# Patient Record
Sex: Female | Born: 1945 | Race: Black or African American | Hispanic: No | Smoking: Never smoker
Health system: Southern US, Community
[De-identification: ages and names within clinical notes are randomized; demographics above are authoritative.]

## PROBLEM LIST (undated history)

## (undated) DIAGNOSIS — I10 Essential (primary) hypertension: Secondary | ICD-10-CM

## (undated) HISTORY — PX: TONSILLECTOMY: SUR1361

---

## 2007-09-12 ENCOUNTER — Emergency Department (HOSPITAL_COMMUNITY): Admission: EM | Admit: 2007-09-12 | Discharge: 2007-09-12 | Payer: Self-pay | Admitting: Family Medicine

## 2007-09-13 ENCOUNTER — Emergency Department (HOSPITAL_COMMUNITY): Admission: EM | Admit: 2007-09-13 | Discharge: 2007-09-14 | Payer: Self-pay | Admitting: Family Medicine

## 2007-10-04 ENCOUNTER — Ambulatory Visit: Payer: Self-pay | Admitting: Internal Medicine

## 2007-10-06 ENCOUNTER — Ambulatory Visit: Payer: Self-pay | Admitting: *Deleted

## 2007-10-20 ENCOUNTER — Ambulatory Visit: Payer: Self-pay | Admitting: Internal Medicine

## 2007-11-20 ENCOUNTER — Ambulatory Visit: Payer: Self-pay | Admitting: Internal Medicine

## 2007-12-04 ENCOUNTER — Ambulatory Visit: Payer: Self-pay | Admitting: Internal Medicine

## 2007-12-25 ENCOUNTER — Ambulatory Visit: Payer: Self-pay | Admitting: Internal Medicine

## 2008-01-15 ENCOUNTER — Ambulatory Visit: Payer: Self-pay | Admitting: Internal Medicine

## 2008-02-15 ENCOUNTER — Encounter (INDEPENDENT_AMBULATORY_CARE_PROVIDER_SITE_OTHER): Payer: Self-pay | Admitting: Family Medicine

## 2008-02-15 ENCOUNTER — Ambulatory Visit: Payer: Self-pay | Admitting: Internal Medicine

## 2008-02-15 LAB — CONVERTED CEMR LAB
AST: 78 units/L — ABNORMAL HIGH (ref 0–37)
Alkaline Phosphatase: 342 units/L — ABNORMAL HIGH (ref 39–117)
BUN: 12 mg/dL (ref 6–23)
Glucose, Bld: 132 mg/dL — ABNORMAL HIGH (ref 70–99)
HDL: 41 mg/dL (ref >39)
LDL Cholesterol: 153 mg/dL — ABNORMAL HIGH (ref 0–99)
Total Bilirubin: 0.8 mg/dL (ref 0.3–1.2)
Total CHOL/HDL Ratio: 5.4
Triglycerides: 128 mg/dL (ref <150)
VLDL: 26 mg/dL (ref 0–40)

## 2008-03-14 ENCOUNTER — Ambulatory Visit: Payer: Self-pay | Admitting: Internal Medicine

## 2008-03-15 ENCOUNTER — Encounter (INDEPENDENT_AMBULATORY_CARE_PROVIDER_SITE_OTHER): Payer: Self-pay | Admitting: Family Medicine

## 2008-03-15 LAB — CONVERTED CEMR LAB: Hepatitis B Surface Ag: NEGATIVE

## 2008-03-20 ENCOUNTER — Ambulatory Visit: Payer: Self-pay | Admitting: Internal Medicine

## 2008-05-14 ENCOUNTER — Ambulatory Visit: Payer: Self-pay | Admitting: Internal Medicine

## 2009-10-04 IMAGING — CR DG CHEST 2V
2 series · 2 of 2 positions shown · non-contrast
Comparison: None.

CLINICAL DATA: Chest pain and hypertension.  
 CHEST - 2 VIEW ? 09/13/07:

[w chest pa]
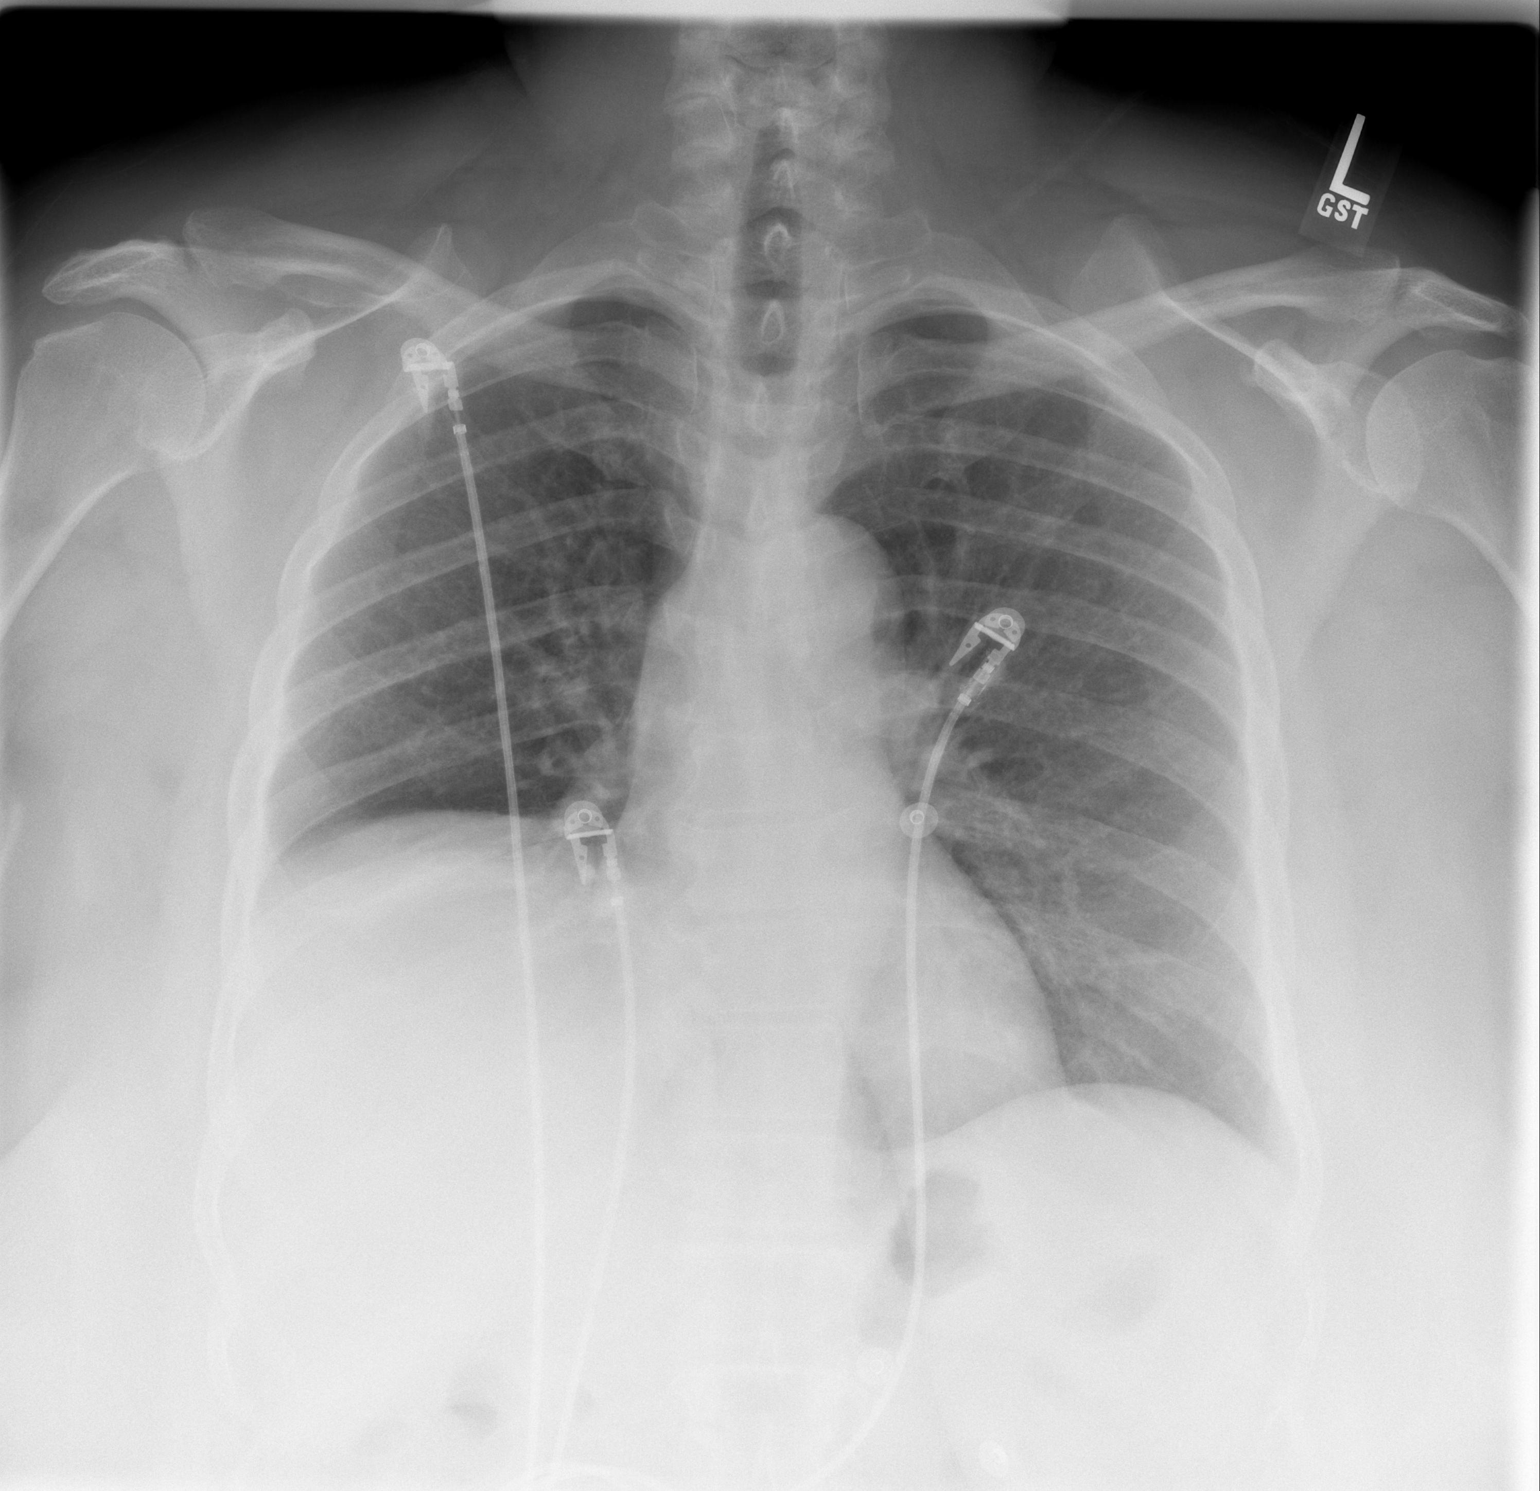

[w chest lat]
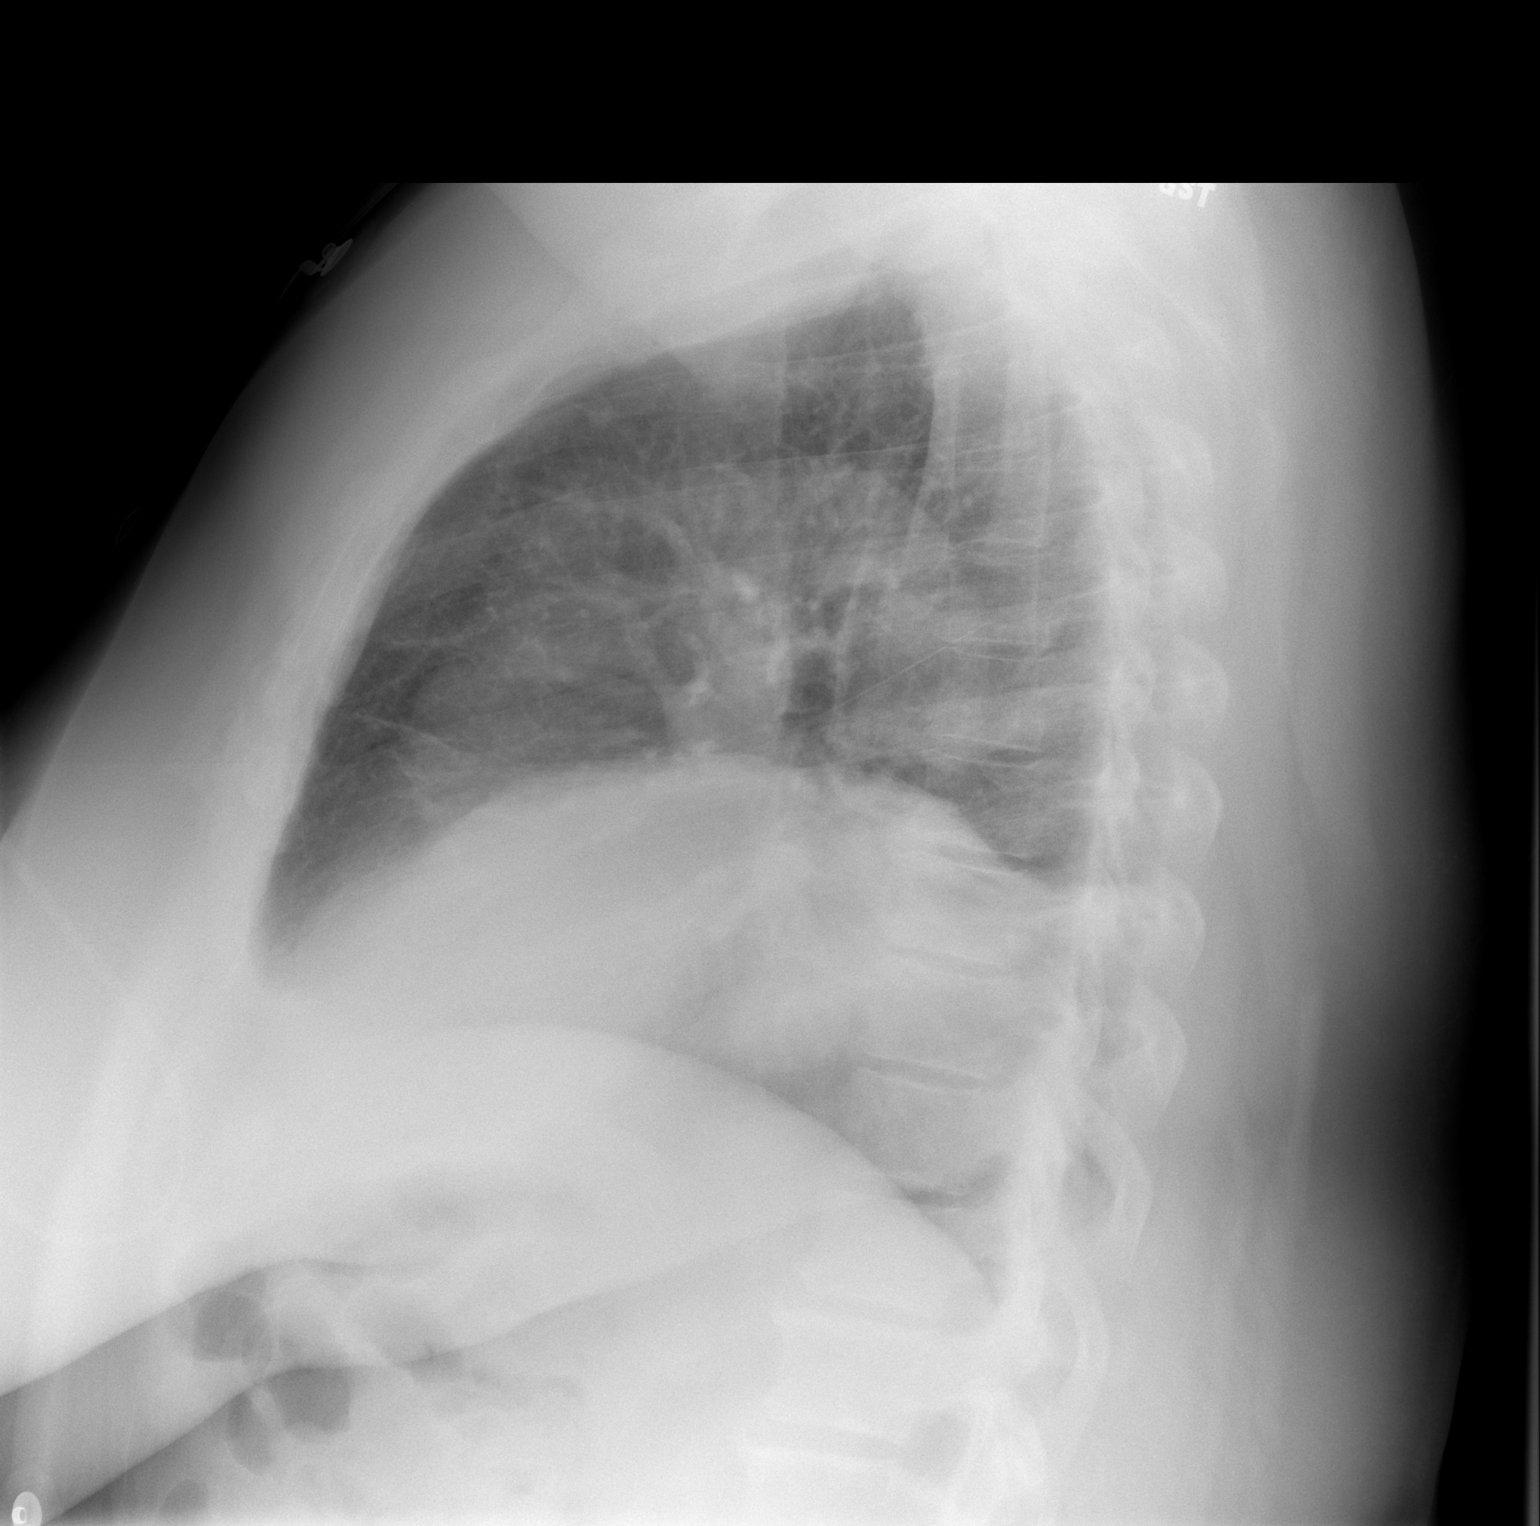

[2 of 2 positions shown; findings below may reference images not displayed]

FINDINGS: There is elevation of the right hemidiaphragm, up to 7 cm above the left hemidiaphragm.  There is some passive atelectasis at the right lung base.  No significant blunting of the right costophrenic angle is identified, although there may be slight blunting of the posterior costophrenic angle on the right such that subpulmonic effusion cannot be totally excluded. 
 The left lung appears clear.  Cardiac and mediastinal contours appear normal.
IMPRESSION: Elevation of the right hemidiaphragm with right basilar atelectasis.  I cannot totally exclude subpulmonic effusion on the right.

## 2009-10-05 IMAGING — CT CT CHEST W/ CM
2 of 3 series · 15 of 36 positions shown, 18 images · IV contrast (APPLIED)
Comparison: Chest radiograph dated 09/13/07.

CLINICAL DATA: 61 year-old-female question subpulmonic effusion.  Chest pain and shortness of breath. 
CHEST CT WITH CONTRAST:
TECHNIQUE: Multidetector CT imaging of the chest was performed following the standard protocol during bolus administration of intravenous contrast.
Contrast:    100 mL Omnipaque 300.

[Series 2: routine chest 5.0 st · axial · 0.71mm/px · z∈[-299,-84]mm · 12 of 51 slices shown, 15 images]
[im 4/51  mediastinal]
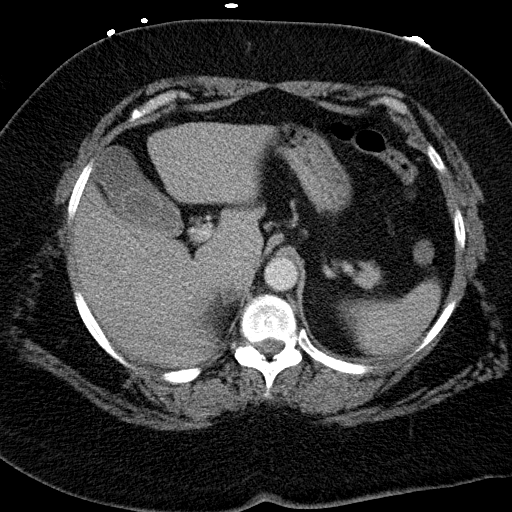
[im 4/51  lung]
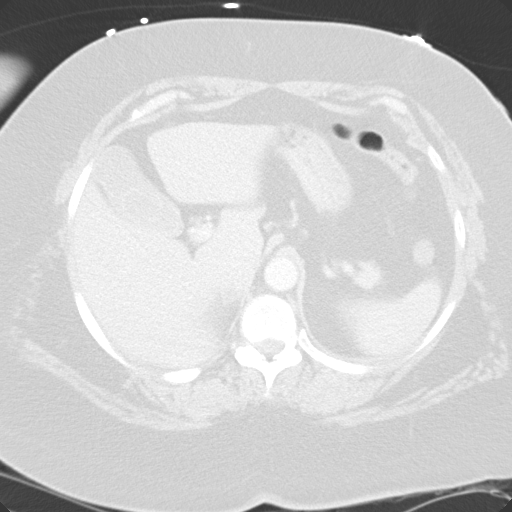
[im 8/51  lung]
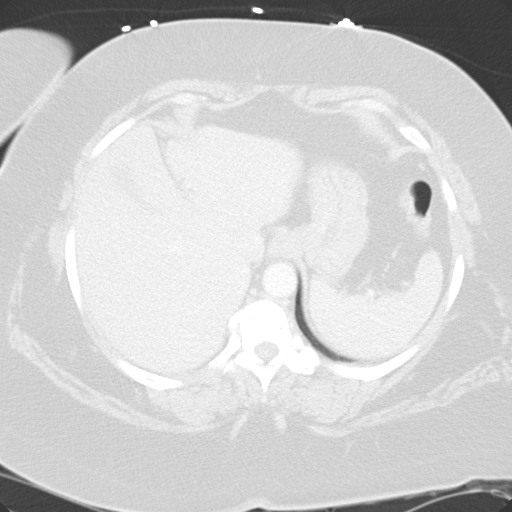
[im 12/51  lung]
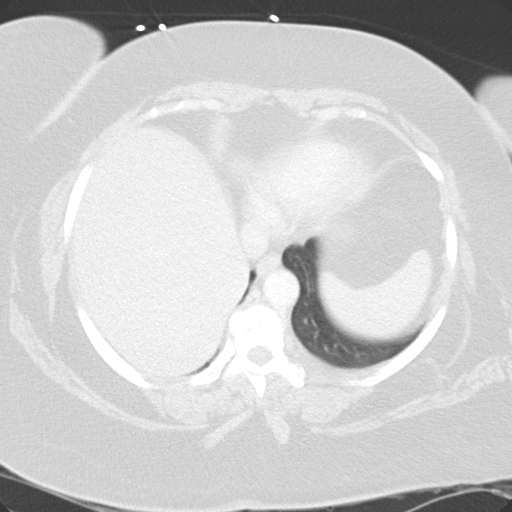
[im 15/51  lung]
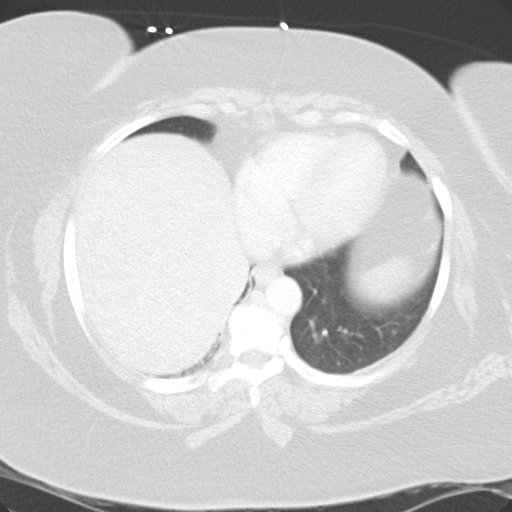
[im 19/51  mediastinal]
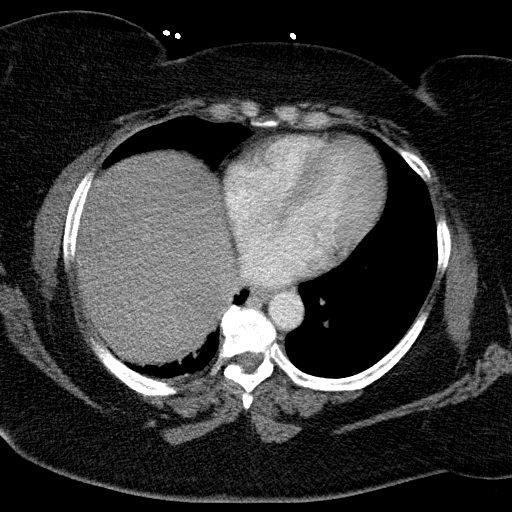
[im 19/51  lung]
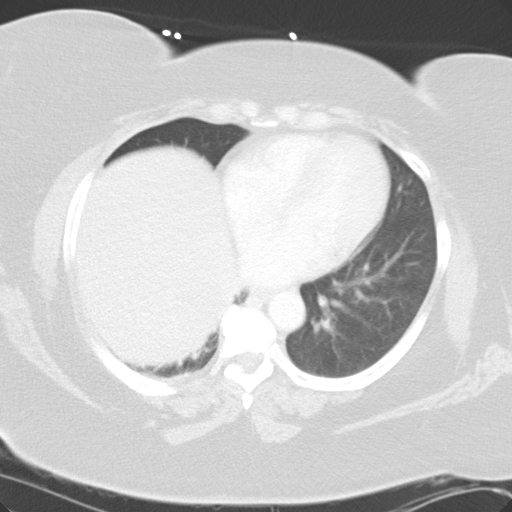
[im 23/51  lung]
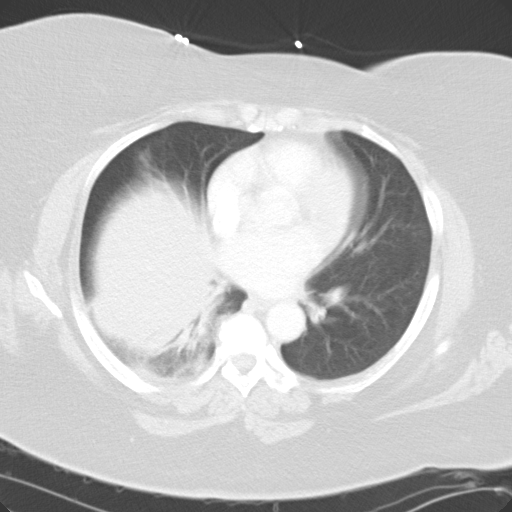
[im 28/51  lung]
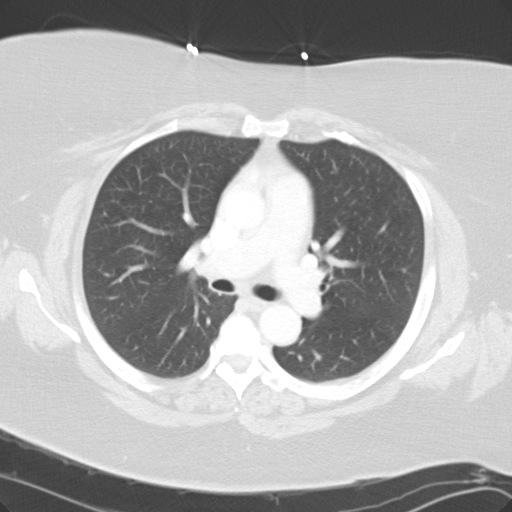
[im 32/51  lung]
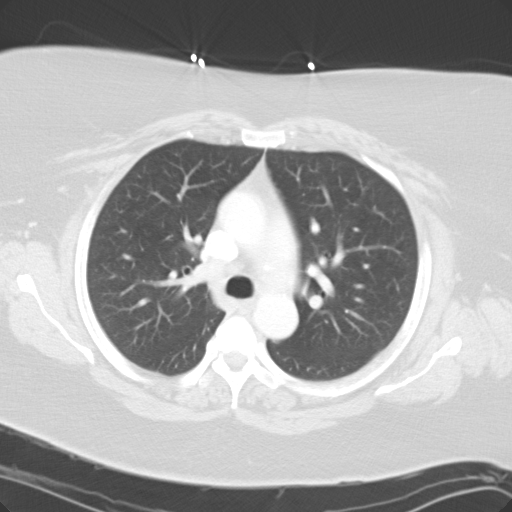
[im 36/51  mediastinal]
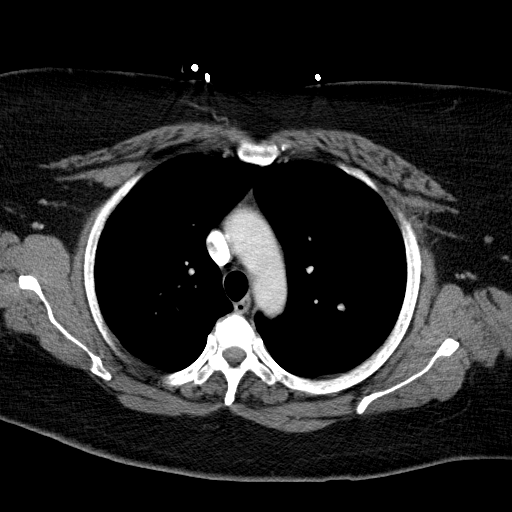
[im 36/51  lung]
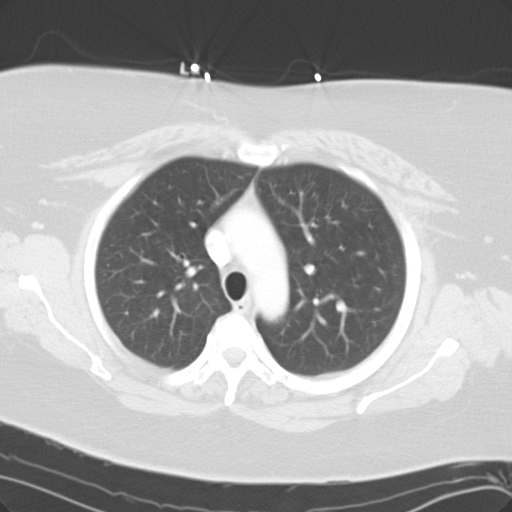
[im 39/51  lung]
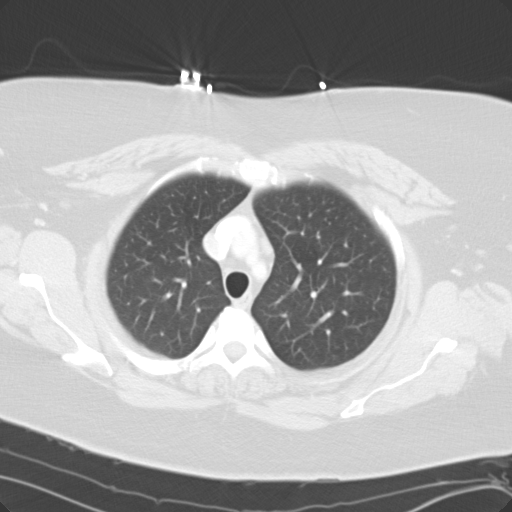
[im 43/51  lung]
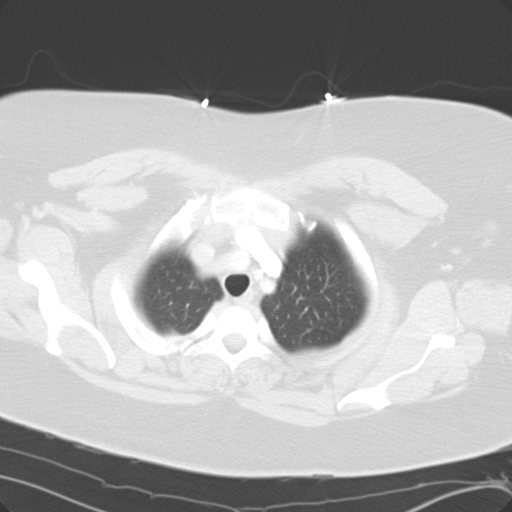
[im 47/51  lung]
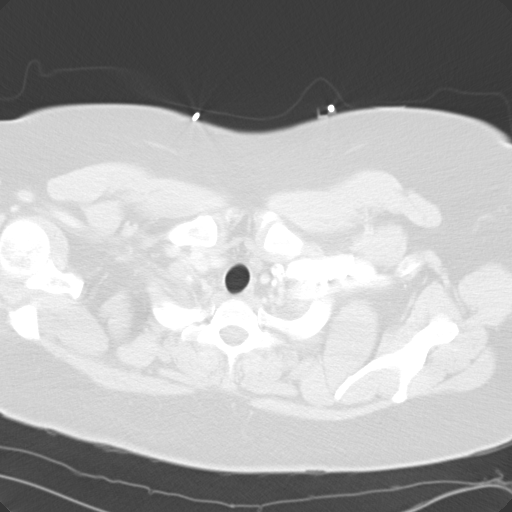

[Series 4: routine chest 2.0 st · coronal · 0.63mm/px · 3 of 143 slices shown]
[im 29/143  lung]
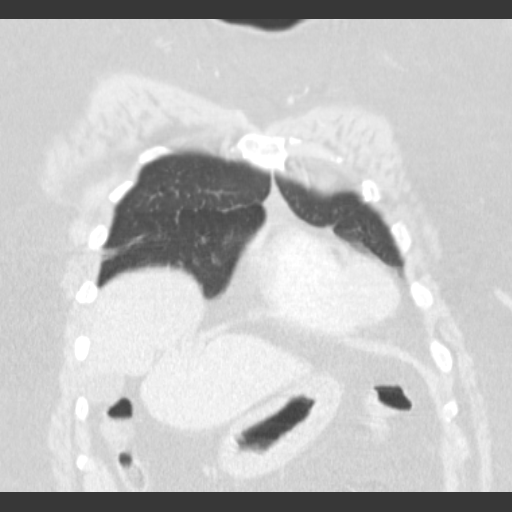
[im 57/143  lung]
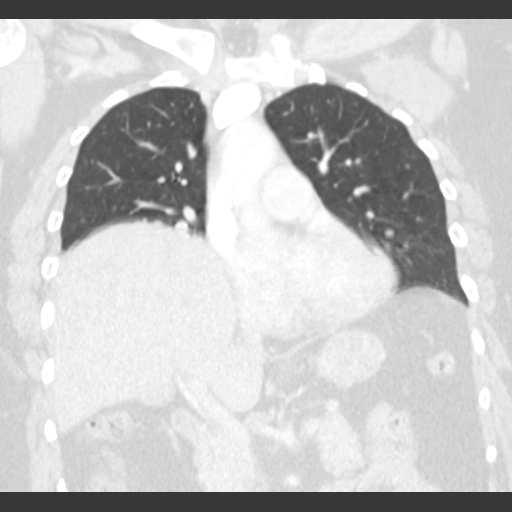
[im 86/143  lung]
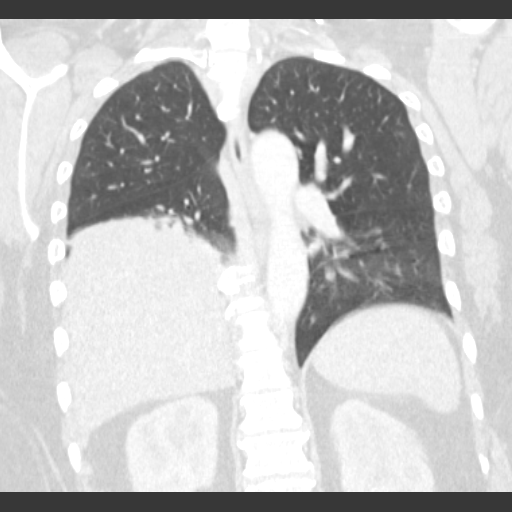

[15 of 36 positions shown; findings below may reference images not displayed]

FINDINGS: The right hemidiaphragm is elevated.  There is no right pleural effusion.  There is associated compressive right lung base atelectasis.   There is minimal atelectasis in the left lung; otherwise, the lungs are clear.  The major airways are clear. The visualized osseous structures are within normal limits.   
There is a large gallstone suspected in the body of the gallbladder measuring 2.6 cm.  The visualized liver parenchymal is homogeneously enhancing.  Other visualized upper abdominal viscera are within normal limits.  No pericardial effusion.  No mediastinal or axillary lymphadenopathy.  The visualized thoracic inlet within normal limits.
IMPRESSION: 1.  Elevated right hemidiaphragm, otherwise no pulmonary abnormality.  
2.  Large gallstone.

## 2011-03-29 LAB — POCT CARDIAC MARKERS
Myoglobin, poc: 115
Operator id: 270651
Troponin i, poc: <0.05

## 2011-03-29 LAB — I-STAT 8, (EC8 V) (CONVERTED LAB)
BUN: 11
Chloride: 106
Glucose, Bld: 71
Hemoglobin: 16.7 — ABNORMAL HIGH
Operator id: 270651
pCO2, Ven: 45.7
pH, Ven: 7.364 — ABNORMAL HIGH

## 2011-03-29 LAB — POCT I-STAT CREATININE: Creatinine, Ser: 1

## 2022-02-18 ENCOUNTER — Inpatient Hospital Stay (HOSPITAL_COMMUNITY)
Admission: EM | Admit: 2022-02-18 | Discharge: 2022-02-24 | DRG: 603 | Disposition: A | Discharge status: Skilled Nursing Facility | Payer: Medicare Other | Attending: Family Medicine | Admitting: Family Medicine

## 2022-02-18 ENCOUNTER — Other Ambulatory Visit: Payer: Self-pay

## 2022-02-18 ENCOUNTER — Encounter (HOSPITAL_COMMUNITY): Payer: Self-pay

## 2022-02-18 ENCOUNTER — Emergency Department (HOSPITAL_COMMUNITY): Payer: Medicare Other

## 2022-02-18 DIAGNOSIS — E66813 Obesity, class 3: Secondary | ICD-10-CM

## 2022-02-18 DIAGNOSIS — L03116 Cellulitis of left lower limb: Secondary | ICD-10-CM | POA: Diagnosis not present

## 2022-02-18 DIAGNOSIS — I872 Venous insufficiency (chronic) (peripheral): Secondary | ICD-10-CM | POA: Diagnosis present

## 2022-02-18 DIAGNOSIS — I890 Lymphedema, not elsewhere classified: Secondary | ICD-10-CM

## 2022-02-18 DIAGNOSIS — I050 Rheumatic mitral stenosis: Secondary | ICD-10-CM | POA: Diagnosis present

## 2022-02-18 DIAGNOSIS — Z789 Other specified health status: Secondary | ICD-10-CM

## 2022-02-18 DIAGNOSIS — R600 Localized edema: Secondary | ICD-10-CM

## 2022-02-18 DIAGNOSIS — I878 Other specified disorders of veins: Secondary | ICD-10-CM | POA: Diagnosis present

## 2022-02-18 DIAGNOSIS — M7732 Calcaneal spur, left foot: Secondary | ICD-10-CM | POA: Diagnosis present

## 2022-02-18 DIAGNOSIS — R5381 Other malaise: Secondary | ICD-10-CM | POA: Diagnosis present

## 2022-02-18 DIAGNOSIS — I5032 Chronic diastolic (congestive) heart failure: Secondary | ICD-10-CM

## 2022-02-18 DIAGNOSIS — R52 Pain, unspecified: Secondary | ICD-10-CM | POA: Diagnosis not present

## 2022-02-18 DIAGNOSIS — I480 Paroxysmal atrial fibrillation: Secondary | ICD-10-CM

## 2022-02-18 DIAGNOSIS — M79672 Pain in left foot: Secondary | ICD-10-CM | POA: Diagnosis not present

## 2022-02-18 DIAGNOSIS — I4892 Unspecified atrial flutter: Secondary | ICD-10-CM | POA: Diagnosis present

## 2022-02-18 DIAGNOSIS — I110 Hypertensive heart disease with heart failure: Secondary | ICD-10-CM | POA: Diagnosis present

## 2022-02-18 DIAGNOSIS — Z6841 Body Mass Index (BMI) 40.0 and over, adult: Secondary | ICD-10-CM

## 2022-02-18 DIAGNOSIS — I10 Essential (primary) hypertension: Secondary | ICD-10-CM

## 2022-02-18 DIAGNOSIS — Z5919 Other inadequate housing: Secondary | ICD-10-CM

## 2022-02-18 HISTORY — DX: Essential (primary) hypertension: I10

## 2022-02-18 LAB — CBC WITH DIFFERENTIAL/PLATELET
Abs Immature Granulocytes: 0.06 10*3/uL (ref 0.00–0.07)
Basophils Absolute: 0.1 10*3/uL (ref 0.0–0.1)
Basophils Relative: 0 %
Eosinophils Absolute: 0.1 10*3/uL (ref 0.0–0.5)
Eosinophils Relative: 1 %
HCT: 40.9 % (ref 36.0–46.0)
Hemoglobin: 13.3 g/dL (ref 12.0–15.0)
Immature Granulocytes: 0 %
Lymphocytes Relative: 11 %
Lymphs Abs: 1.6 10*3/uL (ref 0.7–4.0)
MCH: 27.9 pg (ref 26.0–34.0)
MCHC: 32.5 g/dL (ref 30.0–36.0)
MCV: 85.9 fL (ref 80.0–100.0)
Monocytes Absolute: 1.3 10*3/uL — ABNORMAL HIGH (ref 0.1–1.0)
Monocytes Relative: 9 %
Neutro Abs: 11.6 10*3/uL — ABNORMAL HIGH (ref 1.7–7.7)
Neutrophils Relative %: 79 %
Platelets: 286 10*3/uL (ref 150–400)
RBC: 4.76 MIL/uL (ref 3.87–5.11)
RDW: 15.3 % (ref 11.5–15.5)
WBC: 14.6 10*3/uL — ABNORMAL HIGH (ref 4.0–10.5)
nRBC: 0 % (ref 0.0–0.2)

## 2022-02-18 LAB — COMPREHENSIVE METABOLIC PANEL
ALT: 29 U/L (ref 0–44)
AST: 35 U/L (ref 15–41)
Albumin: 3.5 g/dL (ref 3.5–5.0)
Alkaline Phosphatase: 63 U/L (ref 38–126)
Anion gap: 8 (ref 5–15)
BUN: 13 mg/dL (ref 8–23)
CO2: 28 mmol/L (ref 22–32)
Calcium: 8.8 mg/dL — ABNORMAL LOW (ref 8.9–10.3)
Chloride: 103 mmol/L (ref 98–111)
Creatinine, Ser: 0.75 mg/dL (ref 0.44–1.00)
GFR, Estimated: >60 mL/min (ref >60)
Glucose, Bld: 106 mg/dL — ABNORMAL HIGH (ref 70–99)
Potassium: 3.6 mmol/L (ref 3.5–5.1)
Sodium: 139 mmol/L (ref 135–145)
Total Bilirubin: 0.7 mg/dL (ref 0.3–1.2)
Total Protein: 7.6 g/dL (ref 6.5–8.1)

## 2022-02-18 LAB — BRAIN NATRIURETIC PEPTIDE: B Natriuretic Peptide: 62.9 pg/mL (ref 0.0–100.0)

## 2022-02-18 MED ORDER — IBUPROFEN 800 MG PO TABS
800.0000 mg | ORAL_TABLET | Freq: Once | ORAL | Status: AC
  Administered 2022-02-18: 800 mg via ORAL
  Filled 2022-02-18: qty 1

## 2022-02-18 MED ORDER — HYDRALAZINE HCL 20 MG/ML IJ SOLN: 5.0000 mg | Freq: Four times a day (QID) | INTRAMUSCULAR | Status: DC | PRN

## 2022-02-18 MED ORDER — FUROSEMIDE 10 MG/ML IJ SOLN
40.0000 mg | Freq: Every day | INTRAMUSCULAR | Status: DC
  Administered 2022-02-19 – 2022-02-21 (×4): 40 mg via INTRAVENOUS
  Filled 2022-02-18 (×4): qty 4

## 2022-02-18 MED ORDER — ACETAMINOPHEN 500 MG PO TABS: 1000.0000 mg | ORAL_TABLET | Freq: Three times a day (TID) | ORAL | Status: DC | PRN

## 2022-02-18 NOTE — ED Triage Notes (Signed)
PT arrives via EMS from the hotel she lives at. Pt reports ongoing pain in her left foot. Ems reports patient has bed bugs.

## 2022-02-18 NOTE — ED Provider Notes (Signed)
[facility]-EMERGENCY DEPT Provider Note   CSN: [id number] Arrival date & time: 02/18/22  1323     History  Chief Complaint  Patient presents with   Foot Pain    Sandra [name] is a 76 y.o. female.   Foot Pain   76 year old female presents emergency department with complaints of left heel pain.  Patient states that symptoms began approximately 2 months ago when she is wearing bedroom slippers that have plastic protrusion on the heel.  She noticed a wound after multiple weeks of using slippers.  Area has continually callused over with intermittent using in between.  She has increased heel pain and has been more sedentary over the past 2 months.  She has been spending most of her time in her recliner without elevation of the feet.  Patient is noted significantly increased swelling in lower extremities that has not gone down over the past 2 months given her sedentary status.  She denies history of DVT but is concerned about bilateral lower extremity swelling.  She denies chest pain or shortness of breath, fever, chills, night sweats, nausea, vomiting.  Daughter and son are in the room and are concerned about not being able to move the patient around in the house or to and from the house given how sedentary she has been and how difficult she is to move.    Past medical history significant for hypertension.  Home Medications Prior to Admission medications   Not on File      Allergies    Patient has no known allergies.    Review of Systems   Review of Systems  All other systems reviewed and are negative.   Physical Exam Updated Vital Signs BP (!) 184/80 (BP Location: Right Arm)   Pulse (!) 114 Comment: After ambulating  Temp 98.6 F (37 C) (Oral)   Resp 18   SpO2 95%  Physical Exam Vitals and nursing note reviewed.  Constitutional:      General: She is not in acute distress.    Appearance: She is well-developed.  HENT:     Head: Normocephalic and  atraumatic.  Eyes:     Conjunctiva/sclera: Conjunctivae normal.  Cardiovascular:     Rate and Rhythm: Normal rate and regular rhythm.     Heart sounds: No murmur heard.    Comments: Patient initially tachycardic upon moving to room but has not been tachycardic since initial reading. Pulmonary:     Effort: Pulmonary effort is normal. No respiratory distress.     Breath sounds: Normal breath sounds.  Abdominal:     Palpations: Abdomen is soft.     Tenderness: There is no abdominal tenderness.  Musculoskeletal:        General: No swelling.     Cervical back: Normal range of motion and neck supple. No rigidity or tenderness.     Right lower leg: Edema present.     Left lower leg: Edema present.     Comments: 3+ pitting edema noted bilateral lower extremities.  Skin:    General: Skin is warm and dry.     Capillary Refill: Capillary refill takes less than 2 seconds.     Comments: Area of erythema noted on patient's left heel with overlying callus.  Callus was removed which showed no obvious gross openings in skin.  Area is exquisitely tender to palpation.  Neurological:     General: No focal deficit present.     Mental Status: She is alert and oriented to  person, place, and time.  Psychiatric:        Mood and Affect: Mood normal.      ED Results / Procedures / Treatments   Labs (all labs ordered are listed, but only abnormal results are displayed) Labs Reviewed  COMPREHENSIVE METABOLIC PANEL - Abnormal; Notable for the following components:      Result Value   Glucose, Bld 106 (*)    Calcium 8.8 (*)    All other components within normal limits  CBC WITH DIFFERENTIAL/PLATELET - Abnormal; Notable for the following components:   WBC 14.6 (*)    Neutro Abs 11.6 (*)    Monocytes Absolute 1.3 (*)    All other components within normal limits  LACTIC ACID, PLASMA  LACTIC ACID, PLASMA    EKG None  Radiology VAS [place] LOWER EXTREMITY VENOUS (DVT) (7a-7p)  Result Date: 02/18/2022   Lower Venous DVT Study Patient Name:  LULAMAE THIELBAR  Date of Exam:   02/18/2022 Medical Rec #: [id number]          Accession #:    [id number] Date of Birth: [birth year]          Patient Gender: F Patient Age:   86 years Exam Location:  [facility] Procedure:      VAS [place] LOWER EXTREMITY VENOUS (DVT) Referring Phys: Eyoel Throgmorton --------------------------------------------------------------------------------  Indications: Pain.  Limitations: Body habitus. Comparison Study: No previous exam noted. Performing Technologist: Bobetta Lime BS, RVT  Examination Guidelines: A complete evaluation includes B-mode imaging, spectral Doppler, color Doppler, and power Doppler as needed of all accessible portions of each vessel. Bilateral testing is considered an integral part of a complete examination. Limited examinations for reoccurring indications may be performed as noted. The reflux portion of the exam is performed with the patient in reverse Trendelenburg.  +---------+---------------+---------+-----------+----------+-------------------+ RIGHT    CompressibilityPhasicitySpontaneityPropertiesThrombus Aging      +---------+---------------+---------+-----------+----------+-------------------+ CFV      Full           Yes      Yes                                      +---------+---------------+---------+-----------+----------+-------------------+ SFJ      Full                                                             +---------+---------------+---------+-----------+----------+-------------------+ FV Prox  Full                                                             +---------+---------------+---------+-----------+----------+-------------------+ FV Mid   Full                                         Not well visualized +---------+---------------+---------+-----------+----------+-------------------+ FV DistalFull  Not well visualized  +---------+---------------+---------+-----------+----------+-------------------+ PFV      Full                                                             +---------+---------------+---------+-----------+----------+-------------------+ POP      Full           Yes      Yes                                      +---------+---------------+---------+-----------+----------+-------------------+ PTV                                                   Not well visualized +---------+---------------+---------+-----------+----------+-------------------+ PERO                                                  Not well visualized +---------+---------------+---------+-----------+----------+-------------------+   Right Technical Findings: The right mid and distal femoral vein segments were not well visualized by B-mode secondary to body habitus, however they appear patent by color imaging. The right calf veins were not well visualized with B-mode or color imaging.  +---------+---------------+---------+-----------+----------+-------------------+ LEFT     CompressibilityPhasicitySpontaneityPropertiesThrombus Aging      +---------+---------------+---------+-----------+----------+-------------------+ CFV      Full           Yes      Yes                                      +---------+---------------+---------+-----------+----------+-------------------+ SFJ      Full                                                             +---------+---------------+---------+-----------+----------+-------------------+ FV Prox  Full                                                             +---------+---------------+---------+-----------+----------+-------------------+ FV Mid                                                Not well visualized +---------+---------------+---------+-----------+----------+-------------------+ FV Distal                                             Not well  visualized +---------+---------------+---------+-----------+----------+-------------------+  PFV      Full                                                             +---------+---------------+---------+-----------+----------+-------------------+ POP      Full           Yes      Yes                                      +---------+---------------+---------+-----------+----------+-------------------+ PTV                                                   Not well visualized +---------+---------------+---------+-----------+----------+-------------------+ PERO                                                  Not well visualized +---------+---------------+---------+-----------+----------+-------------------+   Left Technical Findings: The left mid and distal femoral vein segments were not well visualized by B-mode secondary to body habitus, however they appear patent by color imaging. The left calf veins were not well visualized with B-mode or color imaging.   Summary: BILATERAL: - No evidence of deep vein thrombosis seen in the lower extremities, bilaterally. -No evidence of popliteal cyst, bilaterally. RIGHT: - Portions of this examination were limited- see technologist comments above.  LEFT: - Portions of this examination were limited- see technologist comments above.  *See table(s) above for measurements and observations.    Preliminary    DG Foot Complete Left  Result Date: 02/18/2022 CLINICAL DATA:  Ongoing left heel pain. EXAM: LEFT FOOT - COMPLETE 3+ VIEW COMPARISON:  None Available. FINDINGS: Diffuse soft tissue swelling, most pronounced dorsally. Moderately large posterior and inferior calcaneal enthesophytes. No fractures, bone destruction or periosteal reaction seen. No soft tissue gas. IMPRESSION: 1. Moderately large posterior and inferior calcaneal enthesophytes. 2. Diffuse soft tissue swelling, most pronounced dorsally. Electronically Signed   By: Beckie Salts M.D.   On:  02/18/2022 16:23    Procedures Procedures    Medications Ordered in ED Medications  ibuprofen (ADVIL) tablet 800 mg (800 mg Oral Given 02/18/22 1648)    ED Course/ Medical Decision Making/ A&P Clinical Course as of 02/18/22 2020  Thu Feb 18, 2022  1935 Consulted Dr. Loney Loh of hospital medicine.  She agreed with admission of the patient and assuming further treatment/care. [CR]    Clinical Course User Index [CR] Peter Garter, PA                           Medical Decision Making Amount and/or Complexity of Data Reviewed Labs: ordered. Radiology: ordered.  Risk Prescription drug management.   This patient presents to the ED for concern of left foot pain, this involves an extensive number of treatment options, and is a complaint that carries with it a high risk of complications and morbidity.  The differential diagnosis includes fracture, strain/sprain, osteomyelitis, septic arthritis, foreign  body retention, cellulitis, erysipelas   Co morbidities that complicate the patient evaluation  See above   Additional history obtained:  Additional history obtained from EMR   Lab Tests:  I Ordered, and personally interpreted labs.  The pertinent results include: No electrolyte abnormality.  Renal function within normal limits.  No ablation hepatic enzymes.  Leukocytosis of 14.6, likely secondary to infection related to left heel.  No evidence of anemia.  Platelets within normal range.  Lactate pending upon admission   Imaging Studies ordered:  I ordered imaging studies including left foot x-ray, vascular ultrasound which showed no evidence of DVT I independently visualized and interpreted imaging which showed moderately large posterior and inferior calcaneal enthesophytes.  Diffuse soft tissue swelling most pronounced dorsally. I agree with the radiologist interpretation  Cardiac Monitoring: / EKG:  The patient was maintained on a cardiac monitor.  I personally viewed  and interpreted the cardiac monitored which showed an underlying rhythm of: Sinus rhythm   Consultations Obtained:  See ED course  Problem List / ED Course / Critical interventions / Medication management  Left heel pain I ordered medication including ibuprofen for pain   Reevaluation of the patient after these medicines showed that the patient stayed the same I have reviewed the patients home medicines and have made adjustments as needed   Social Determinants of Health:  Denies tobacco/illicit drug use.   Test / Admission - Considered:  Left heel pain Vitals signs significant for hypertension and tachycardia initially.  Patient remained afebrile throughout stay.  Patient's heart rate decreased to within normal range with time allotted in the emergency department.. Otherwise within normal range and stable throughout visit. Laboratory/imaging studies significant for: See above Patient was unable to obtain MRI to rule out osteomyelitis.  Still suspicious of osteomyelitis given no break in skin with obvious erythema on affected heel and elevation of white count.  Lactic pending upon admission.  Patient was unable to obtain MRI given current bedbugs covering her body.  Multiple were observed during exam.  Patient's son and daughter were in the room during the end of patient's stay in the emergency department.  They expressed concern giving inability to move patient at home called or in and out of car.  They were unsure of if they wanted SNF placement now and seem most concerned about patient's current complaints of left heel pain and acute bilateral lower extremity swelling.  Hospital medicine was consulted regarding the patient and they agreed with admission of the patient assuming further treatment/care.  Treatment plan was discussed at length with the patient and family and they acknowledge understanding and were agreeable.  Patient was stable upon admission to the  hospital.        Final Clinical Impression(s) / ED Diagnoses Final diagnoses:  Foot pain, left  Impaired mobility and ADLs    Rx / DC Orders ED Discharge Orders     None         Wilnette Kales, [place] 02/18/22 2020    Milton Ferguson, MD 02/20/22 [id number]

## 2022-02-18 NOTE — Progress Notes (Signed)
Bilateral LE venous duplex study completed. Please see CV Proc for preliminary results.  Zamira Hickam BS, RVT 02/18/2022 6:59 PM

## 2022-02-18 NOTE — H&P (Signed)
History and Physical    Sandra [name] [id number] DOB: 1945/11/05 DOA: 02/18/2022  PCP: Patient, No Pcp Per  Patient coming from: Home  Chief Complaint: Left foot pain  HPI: Sandra [name] is a 76 y.o. female with medical history significant of hypertension presented to the ED complaining of left foot/heel pain and bilateral leg swelling.  EMS reported patient having bedbugs.  Tachycardic and hypertensive on arrival to the ED.  Afebrile.  Labs significant for WBC 14.6, lactic acid pending.  X-ray of left foot moderately large posterior and inferior calcaneal enthesophytes and diffuse soft tissue swelling most pronounced dorsally.  X-ray negative for osteomyelitis.  MRI of left foot pending.  Bilateral lower extremity Dopplers negative for DVT. Patient was given ibuprofen.  Patient states about 2 months ago she noticed an abrasion on her left heel which she thinks is due to wearing plastic slippers.  States over time a callus formed in this area and then the skin was peeling off.  She has been soaking this foot in water and hydrogen peroxide and applying petroleum jelly.  She does report bilateral lower extremity edema for several months but believes lately the left foot has been more swollen.  She has not been able to walk much as she experiences significant pain every time she steps on her left foot.  Denies any any open cuts or drainage from this area.  She sits in a chair most of the day and believes that is contributing to her bilateral lower extremity edema as she does not elevate her legs.  She reports history of hypertension but has been off of medications since 2009.  Denies any other medical problems.  Denies fevers or chills.  Denies cough, shortness of breath, or chest pain.  No other complaints.  Review of Systems:  Review of Systems  All other systems reviewed and are negative.   Past Medical History:  Diagnosis Date   Hypertension     Past Surgical History:  Procedure  Laterality Date   TONSILLECTOMY       reports that she does not have a smoking history on file. She has been exposed to tobacco smoke. She has never used smokeless tobacco. She reports that she does not currently use alcohol. She reports that she does not use drugs.  No Known Allergies  History reviewed. No pertinent family history.  Prior to Admission medications   Not on File    Physical Exam: Vitals:   02/18/22 1333  BP: (!) 184/80  Pulse: (!) 114  Resp: 18  Temp: 98.6 F (37 C)  TempSrc: Oral  SpO2: 95%    Physical Exam Vitals reviewed.  Constitutional:      General: She is not in acute distress. HENT:     Head: Normocephalic and atraumatic.  Eyes:     Extraocular Movements: Extraocular movements intact.  Cardiovascular:     Rate and Rhythm: Normal rate and regular rhythm.     Pulses: Normal pulses.  Pulmonary:     Effort: Pulmonary effort is normal. No respiratory distress.     Breath sounds: Normal breath sounds. No wheezing or rales.  Abdominal:     General: Bowel sounds are normal. There is no distension.     Palpations: Abdomen is soft.     Tenderness: There is no abdominal tenderness.  Musculoskeletal:     Cervical back: Normal range of motion.     Right lower leg: Edema present.     Left lower leg: Edema  present.     Comments: +4 pitting edema of bilateral lower extremities Left heel: Mildly erythematous with overlying callus which was removed earlier in the ED.  No openings in the skin or drainage.  Area is nontender to palpation.  Skin:    General: Skin is warm and dry.  Neurological:     General: No focal deficit present.     Mental Status: She is alert and oriented to person, place, and time.       Labs on Admission: I have personally reviewed following labs and imaging studies  CBC: Recent Labs  Lab 02/18/22 1506  WBC 14.6*  NEUTROABS 11.6*  HGB 13.3  HCT 40.9  MCV 85.9  PLT 286   Basic Metabolic Panel: Recent Labs  Lab  02/18/22 1506  NA 139  K 3.6  CL 103  CO2 28  GLUCOSE 106*  BUN 13  CREATININE 0.75  CALCIUM 8.8*   GFR: CrCl cannot be calculated (Unknown ideal weight.). Liver Function Tests: Recent Labs  Lab 02/18/22 1506  AST 35  ALT 29  ALKPHOS 63  BILITOT 0.7  PROT 7.6  ALBUMIN 3.5   No results for input(s): "LIPASE", "AMYLASE" in the last 168 hours. No results for input(s): "AMMONIA" in the last 168 hours. Coagulation Profile: No results for input(s): "INR", "PROTIME" in the last 168 hours. Cardiac Enzymes: No results for input(s): "CKTOTAL", "CKMB", "CKMBINDEX", "TROPONINI" in the last 168 hours. BNP (last 3 results) No results for input(s): "PROBNP" in the last 8760 hours. HbA1C: No results for input(s): "HGBA1C" in the last 72 hours. CBG: No results for input(s): "GLUCAP" in the last 168 hours. Lipid Profile: No results for input(s): "CHOL", "HDL", "LDLCALC", "TRIG", "CHOLHDL", "LDLDIRECT" in the last 72 hours. Thyroid Function Tests: No results for input(s): "TSH", "T4TOTAL", "FREET4", "T3FREE", "THYROIDAB" in the last 72 hours. Anemia Panel: No results for input(s): "VITAMINB12", "FOLATE", "FERRITIN", "TIBC", "IRON", "RETICCTPCT" in the last 72 hours. Urine analysis: No results found for: "COLORURINE", "APPEARANCEUR", "LABSPEC", "PHURINE", "GLUCOSEU", "HGBUR", "BILIRUBINUR", "KETONESUR", "PROTEINUR", "UROBILINOGEN", "NITRITE", "LEUKOCYTESUR"  Radiological Exams on Admission: I have personally reviewed images VAS [place] LOWER EXTREMITY VENOUS (DVT) (7a-7p)  Result Date: 02/18/2022  Lower Venous DVT Study Patient Name:  Sandra [name]  Date of Exam:   02/18/2022 Medical Rec #: [id number]          Accession #:    [id number] Date of Birth: 1946/04/28          Patient Gender: F Patient Age:   65 years Exam Location:  [facility] Procedure:      VAS [place] LOWER EXTREMITY VENOUS (DVT) Referring Phys: COOPER ROBBINS  --------------------------------------------------------------------------------  Indications: Pain.  Limitations: Body habitus. Comparison Study: No previous exam noted. Performing Technologist: Magdalene River BS, RVT  Examination Guidelines: A complete evaluation includes B-mode imaging, spectral Doppler, color Doppler, and power Doppler as needed of all accessible portions of each vessel. Bilateral testing is considered an integral part of a complete examination. Limited examinations for reoccurring indications may be performed as noted. The reflux portion of the exam is performed with the patient in reverse Trendelenburg.  +---------+---------------+---------+-----------+----------+-------------------+ RIGHT    CompressibilityPhasicitySpontaneityPropertiesThrombus Aging      +---------+---------------+---------+-----------+----------+-------------------+ CFV      Full           Yes      Yes                                      +---------+---------------+---------+-----------+----------+-------------------+  SFJ      Full                                                             +---------+---------------+---------+-----------+----------+-------------------+ FV Prox  Full                                                             +---------+---------------+---------+-----------+----------+-------------------+ FV Mid   Full                                         Not well visualized +---------+---------------+---------+-----------+----------+-------------------+ FV DistalFull                                         Not well visualized +---------+---------------+---------+-----------+----------+-------------------+ PFV      Full                                                             +---------+---------------+---------+-----------+----------+-------------------+ POP      Full           Yes      Yes                                       +---------+---------------+---------+-----------+----------+-------------------+ PTV                                                   Not well visualized +---------+---------------+---------+-----------+----------+-------------------+ PERO                                                  Not well visualized +---------+---------------+---------+-----------+----------+-------------------+   Right Technical Findings: The right mid and distal femoral vein segments were not well visualized by B-mode secondary to body habitus, however they appear patent by color imaging. The right calf veins were not well visualized with B-mode or color imaging.  +---------+---------------+---------+-----------+----------+-------------------+ LEFT     CompressibilityPhasicitySpontaneityPropertiesThrombus Aging      +---------+---------------+---------+-----------+----------+-------------------+ CFV      Full           Yes      Yes                                      +---------+---------------+---------+-----------+----------+-------------------+ SFJ      Full                                                             +---------+---------------+---------+-----------+----------+-------------------+  FV Prox  Full                                                             +---------+---------------+---------+-----------+----------+-------------------+ FV Mid                                                Not well visualized +---------+---------------+---------+-----------+----------+-------------------+ FV Distal                                             Not well visualized +---------+---------------+---------+-----------+----------+-------------------+ PFV      Full                                                             +---------+---------------+---------+-----------+----------+-------------------+ POP      Full           Yes      Yes                                       +---------+---------------+---------+-----------+----------+-------------------+ PTV                                                   Not well visualized +---------+---------------+---------+-----------+----------+-------------------+ PERO                                                  Not well visualized +---------+---------------+---------+-----------+----------+-------------------+   Left Technical Findings: The left mid and distal femoral vein segments were not well visualized by B-mode secondary to body habitus, however they appear patent by color imaging. The left calf veins were not well visualized with B-mode or color imaging.   Summary: BILATERAL: - No evidence of deep vein thrombosis seen in the lower extremities, bilaterally. -No evidence of popliteal cyst, bilaterally. RIGHT: - Portions of this examination were limited- see technologist comments above.  LEFT: - Portions of this examination were limited- see technologist comments above.  *See table(s) above for measurements and observations.    Preliminary    DG Foot Complete Left  Result Date: 02/18/2022 CLINICAL DATA:  Ongoing left heel pain. EXAM: LEFT FOOT - COMPLETE 3+ VIEW COMPARISON:  None Available. FINDINGS: Diffuse soft tissue swelling, most pronounced dorsally. Moderately large posterior and inferior calcaneal enthesophytes. No fractures, bone destruction or periosteal reaction seen. No soft tissue gas. IMPRESSION: 1. Moderately large posterior and inferior calcaneal enthesophytes. 2. Diffuse soft tissue swelling, most pronounced dorsally. Electronically Signed   By: Beckie Salts M.D.   On: 02/18/2022 16:23  Assessment and Plan  Left heel pain Has an area of mild erythema with overlying callus which was removed earlier in the ED.  No open cuts or drainage.  Area is nontender to palpation and does not appear grossly infected.  WBC 14.6.  Tachycardia has resolved and does not meet any other SIRS criteria at  this time. X-ray of left foot moderately large posterior and inferior calcaneal enthesophytes and diffuse soft tissue swelling most pronounced dorsally.  X-ray negative for osteomyelitis.  -MRI of left foot ordered to rule out osteomyelitis and currently pending.  Start antibiotics if MRI confirms infection. -Lactic acid pending -Monitor WBC count -Patient appears very comfortable at rest, Tylenol as needed for pain.  Hypertension Blood pressure elevated on arrival to the ED and not on any antihypertensives at home. -Hydralazine prn -Start scheduled medication if blood pressure continues to be elevated.  Bilateral lower extremity edema Likely due to venous stasis.  Dopplers negative for DVT. Lungs clear on exam and no documented history of CHF.  She has significant +4 pitting edema of bilateral lower extremities. -Start Lasix 40 mg daily -Check BNP -Elevate legs when sitting  DVT prophylaxis: SCDs Code Status: Full Code (discussed with the patient) Family Communication: Son and granddaughter at bedside. Level of care: Telemetry bed Admission status: It is my clinical opinion that referral for OBSERVATION is reasonable and necessary in this patient based on the above information provided. The aforementioned taken together are felt to place the patient at high risk for further clinical deterioration. However, it is anticipated that the patient may be medically stable for discharge from the hospital within 24 to 48 hours.   Sandra Leff MD [facility]  If 7PM-7AM, please contact night-coverage [email or web address]  02/18/2022, 7:43 PM

## 2022-02-19 ENCOUNTER — Inpatient Hospital Stay (HOSPITAL_COMMUNITY): Payer: Medicare Other

## 2022-02-19 ENCOUNTER — Encounter (HOSPITAL_COMMUNITY): Payer: Self-pay | Admitting: Internal Medicine

## 2022-02-19 DIAGNOSIS — R600 Localized edema: Secondary | ICD-10-CM

## 2022-02-19 DIAGNOSIS — I10 Essential (primary) hypertension: Secondary | ICD-10-CM | POA: Diagnosis not present

## 2022-02-19 DIAGNOSIS — I5032 Chronic diastolic (congestive) heart failure: Secondary | ICD-10-CM | POA: Diagnosis present

## 2022-02-19 DIAGNOSIS — I878 Other specified disorders of veins: Secondary | ICD-10-CM | POA: Diagnosis present

## 2022-02-19 DIAGNOSIS — I4891 Unspecified atrial fibrillation: Secondary | ICD-10-CM | POA: Diagnosis not present

## 2022-02-19 DIAGNOSIS — Z6841 Body Mass Index (BMI) 40.0 and over, adult: Secondary | ICD-10-CM | POA: Diagnosis not present

## 2022-02-19 DIAGNOSIS — R5381 Other malaise: Secondary | ICD-10-CM | POA: Diagnosis present

## 2022-02-19 DIAGNOSIS — I342 Nonrheumatic mitral (valve) stenosis: Secondary | ICD-10-CM | POA: Diagnosis not present

## 2022-02-19 DIAGNOSIS — M7732 Calcaneal spur, left foot: Secondary | ICD-10-CM | POA: Diagnosis present

## 2022-02-19 DIAGNOSIS — M79672 Pain in left foot: Secondary | ICD-10-CM | POA: Diagnosis present

## 2022-02-19 DIAGNOSIS — I4892 Unspecified atrial flutter: Secondary | ICD-10-CM | POA: Diagnosis present

## 2022-02-19 DIAGNOSIS — I050 Rheumatic mitral stenosis: Secondary | ICD-10-CM | POA: Diagnosis present

## 2022-02-19 DIAGNOSIS — I509 Heart failure, unspecified: Secondary | ICD-10-CM

## 2022-02-19 DIAGNOSIS — Z5919 Other inadequate housing: Secondary | ICD-10-CM | POA: Diagnosis not present

## 2022-02-19 DIAGNOSIS — I890 Lymphedema, not elsewhere classified: Secondary | ICD-10-CM | POA: Diagnosis present

## 2022-02-19 DIAGNOSIS — I872 Venous insufficiency (chronic) (peripheral): Secondary | ICD-10-CM | POA: Diagnosis present

## 2022-02-19 DIAGNOSIS — I480 Paroxysmal atrial fibrillation: Secondary | ICD-10-CM | POA: Diagnosis present

## 2022-02-19 DIAGNOSIS — L03116 Cellulitis of left lower limb: Secondary | ICD-10-CM | POA: Diagnosis present

## 2022-02-19 DIAGNOSIS — I110 Hypertensive heart disease with heart failure: Secondary | ICD-10-CM | POA: Diagnosis present

## 2022-02-19 LAB — LACTIC ACID, PLASMA
Lactic Acid, Venous: 1.4 mmol/L (ref 0.5–1.9)
Lactic Acid, Venous: 1.5 mmol/L (ref 0.5–1.9)

## 2022-02-19 LAB — ECHOCARDIOGRAM COMPLETE
AR max vel: 2.76 cm2
AV Area VTI: 3.01 cm2
AV Area mean vel: 2.71 cm2
AV Mean grad: 7 mmHg
AV Peak grad: 12.3 mmHg
Ao pk vel: 1.75 m/s
Area-P 1/2: 2.07 cm2
Height: 63 in
MV VTI: 2.61 cm2
S' Lateral: 2.7 cm
Weight: 5291.04 oz

## 2022-02-19 LAB — CBC
HCT: 36.7 % (ref 36.0–46.0)
Hemoglobin: 11.6 g/dL — ABNORMAL LOW (ref 12.0–15.0)
MCH: 27.8 pg (ref 26.0–34.0)
MCHC: 31.6 g/dL (ref 30.0–36.0)
MCV: 88 fL (ref 80.0–100.0)
Platelets: 241 10*3/uL (ref 150–400)
RBC: 4.17 MIL/uL (ref 3.87–5.11)
RDW: 15.5 % (ref 11.5–15.5)
WBC: 11.8 10*3/uL — ABNORMAL HIGH (ref 4.0–10.5)
nRBC: 0 % (ref 0.0–0.2)

## 2022-02-19 MED ORDER — METOPROLOL TARTRATE 5 MG/5ML IV SOLN: 5.0000 mg | Freq: Once | INTRAVENOUS | Status: DC

## 2022-02-19 MED ORDER — PERFLUTREN LIPID MICROSPHERE
1.0000 mL | INTRAVENOUS | Status: AC | PRN
  Administered 2022-02-19: 3 mL via INTRAVENOUS

## 2022-02-19 MED ORDER — HYDRALAZINE HCL 20 MG/ML IJ SOLN: 10.0000 mg | Freq: Four times a day (QID) | INTRAMUSCULAR | Status: DC | PRN

## 2022-02-19 MED ORDER — ENOXAPARIN SODIUM 80 MG/0.8ML IJ SOSY
80.0000 mg | PREFILLED_SYRINGE | Freq: Every day | INTRAMUSCULAR | Status: DC
  Administered 2022-02-19 – 2022-02-23 (×5): 80 mg via SUBCUTANEOUS
  Filled 2022-02-19 (×6): qty 0.8

## 2022-02-19 MED ORDER — GADOBUTROL 1 MMOL/ML IV SOLN
10.0000 mL | Freq: Once | INTRAVENOUS | Status: AC | PRN
Start: 2022-02-19 — End: 2022-02-19
  Administered 2022-02-19: 10 mL via INTRAVENOUS

## 2022-02-19 MED ORDER — MUPIROCIN CALCIUM 2 % EX CREA
TOPICAL_CREAM | Freq: Every day | CUTANEOUS | Status: DC
  Administered 2022-02-23: 1 via TOPICAL
  Filled 2022-02-19: qty 15

## 2022-02-19 NOTE — Progress Notes (Signed)
Brief Pharmacy Note    Pharmacy is consulted to dose enoxaparin for 76 yo female for VTE prophylaxis.   BMI 58 CBC ok SCr < 1    Will dose as enoxaparin 80 mg daily   Rx will sign off   Adalberto Cole, PharmD, BCPS 02/19/2022 1:36 PM

## 2022-02-19 NOTE — Progress Notes (Signed)
   02/19/22 1801  Assess: MEWS Score  Temp 97.9 F (36.6 C)  BP (!) 155/82  MAP (mmHg) 97  Pulse Rate (!) 144  ECG Heart Rate (!) 133  Resp 18  Level of Consciousness Alert  SpO2 98 %  O2 Device Room Air  Assess: MEWS Score  MEWS Temp 0  MEWS Systolic 0  MEWS Pulse 3  MEWS RR 0  MEWS LOC 0  MEWS Score 3  MEWS Score Color Yellow  Assess: if the MEWS score is Yellow or Red  Were vital signs taken at a resting state? Yes  Focused Assessment No change from prior assessment  Does the patient meet 2 or more of the SIRS criteria? Yes  Does the patient have a confirmed or suspected source of infection? No  MEWS guidelines implemented *See Row Information* Yes  Take Vital Signs  Increase Vital Sign Frequency  Yellow: Q 2hr X 2 then Q 4hr X 2, if remains yellow, continue Q 4hrs  Escalate  MEWS: Escalate Yellow: discuss with charge nurse/RN and consider discussing with provider and RRT  Notify: Charge Nurse/RN  Name of Charge Nurse/RN Notified Tommy Medal RN  Date Charge Nurse/RN Notified 02/19/22  Time Charge Nurse/RN Notified 1810  Notify: Provider  Provider Name/Title Hughie Closs MD  Date Provider Notified 02/19/22  Time Provider Notified 1801  Method of Notification Call  Notification Reason Other (Comment) (Fib)  Provider response Other (Comment);See new orders (EKG verbal)  Date of Provider Response 02/19/22  Time of Provider Response 1802  Document  Progress note created (see row info) Yes  Assess: SIRS CRITERIA  SIRS Temperature  0  SIRS Pulse 1  SIRS Respirations  0  SIRS WBC 1  SIRS Score Sum  2

## 2022-02-19 NOTE — Consult Note (Signed)
WOC Nurse Consult Note: Reason for Consult: Consult requested for left heel. Wound type: Left plantar heel with partial thickness wound, 100% dry yellow, surrounded by loose peeling skin and slightly raised dry callous edges. MRI pending to R/O osteomyelitis.  Pressure Injury POA: This is not a pressure injury; patient states it occurred when she hit her foot against an object. Measurement: 3X3X.1cm, no odor, drainage, or fluctuance Dressing procedure/placement/frequency: Float left heel to reduce pressure. Topical treatment orders provided for bedside nurses to perform as follow to promote moist healing: Apply Bactroban cream to left heel Q day, then re-cover with foam dressing.  Change foam dressing Q 3 days or PRN soiling. Please re-consult if further assistance is needed.  Thank-you,  Cammie Mcgee MSN, RN, CWOCN, [place], CNS [phone]

## 2022-02-19 NOTE — Progress Notes (Incomplete)
    BRIEF OVERNIGHT PROGRESS REPORT   SUBJECTIVE: Notified by patient's RN of HR in the 150's sustaining asymptomatic.  OBJECTIVE: On review of chart,  the temperature was 36.9C, the heart rate  145 beats/minute, the blood pressure 139/78  mm Hg, the respiratory rate 20 breaths/minute, and the oxygen saturation 100 % on. RA  EKG: {ekg findings:315101}.    ASSESSMENT:  PLAN:

## 2022-02-19 NOTE — Progress Notes (Signed)
  Echocardiogram 2D Echocardiogram has been performed.  Milda Smart 02/19/2022, 3:34 PM

## 2022-02-19 NOTE — Progress Notes (Signed)
PROGRESS NOTE    [name]  FTD:[id number] DOB: 02-Dec-1945 DOA: 02/18/2022 PCP: Patient, No Pcp Per   Brief Narrative:  HPI: Sandra [name] is a 76 y.o. female with medical history significant of hypertension presented to the ED complaining of left foot/heel pain and bilateral leg swelling.  EMS reported patient having bedbugs.  Tachycardic and hypertensive on arrival to the ED.  Afebrile.  Labs significant for WBC 14.6, lactic acid pending.  X-ray of left foot moderately large posterior and inferior calcaneal enthesophytes and diffuse soft tissue swelling most pronounced dorsally.  X-ray negative for osteomyelitis.  MRI of left foot pending.  Bilateral lower extremity Dopplers negative for DVT. Patient was given ibuprofen.   Patient states about 2 months ago she noticed an abrasion on her left heel which she thinks is due to wearing plastic slippers.  States over time a callus formed in this area and then the skin was peeling off.  She has been soaking this foot in water and hydrogen peroxide and applying petroleum jelly.  She does report bilateral lower extremity edema for several months but believes lately the left foot has been more swollen.  She has not been able to walk much as she experiences significant pain every time she steps on her left foot.  Denies any any open cuts or drainage from this area.  She sits in a chair most of the day and believes that is contributing to her bilateral lower extremity edema as she does not elevate her legs.  She reports history of hypertension but has been off of medications since 2009.  Denies any other medical problems.  Denies fevers or chills.  Denies cough, shortness of breath, or chest pain.  No other complaints.  Assessment & Plan:   Principal Problem:   Pain of left heel Active Problems:   Essential hypertension   Bilateral lower extremity edema  Left heel pain: No open sores.  X-ray negative for osteomyelitis.  No cellulitis on  physical examination or on the x-ray.  I personally doubt osteomyelitis however as ordered by the admitting hospitalist, we will wait for MRI.  Wound care has seen the patient as well.   Hypertension Blood pressure elevated on arrival to the ED and not on any antihypertensives at home.  Started on as needed hydralazine.  Blood pressure improving.  Monitor closely.  Bilateral lower extremity edema: Patient tells me that she has chronic lymphedema and in fact her edema is better today than it usually is.  No previous history of congestive heart failure, no echo in the records.  I will order transthoracic echo.  She has been started on IV Lasix, I will switch her to oral Lasix.  Bedbugs: Reportedly, bedbugs were seen by EMT when she was picked up but none has been seen here.  She is maintained on contact isolation.  Morbid obesity: Weight loss and dietary modification has been counseled.  DVT prophylaxis: SCDs Start: 02/18/22 2051   Code Status: Full Code  Family Communication:  None present at bedside.  Plan of care discussed with patient in length and he/she verbalized understanding and agreed with it.  Status is: Inpatient Remains inpatient appropriate because: MRI foot pending.   Estimated body mass index is 58.58 kg/m as calculated from the following:   Height as of this encounter: 5\' 3"  (1.6 m).   Weight as of this encounter: 150 kg.    Nutritional Assessment: Body mass index is 58.58 kg/m. Seen by dietician.  I  agree with the assessment and plan as outlined below: Nutrition Status:        . Skin Assessment: I have examined the patient's skin and I agree with the wound assessment as performed by the wound care RN as outlined below:    Consultants:  None  Procedures:  None  Antimicrobials:  Anti-infectives (From admission, onward)    None         Subjective: Patient seen and examined.  Complains of left heel pain but no other complaint.  Pain is improving as  well.  Objective: Vitals:   02/18/22 2222 02/19/22 0007 02/19/22 0215 02/19/22 0542  BP: (!) 143/45  139/72 (!) 116/59  Pulse: (!) 103  84 81  Resp: 20   19  Temp: 98 F (36.7 C)  98.5 F (36.9 C) 97.6 F (36.4 C)  TempSrc: Oral  Oral Oral  SpO2: 98%  100% 93%  Weight:  (!) 150 kg    Height:  5\' 3"  (1.6 m)      Intake/Output Summary (Last 24 hours) at 02/19/2022 1304 Last data filed at 02/19/2022 0100 Gross per 24 hour  Intake --  Output 1200 ml  Net -1200 ml   Filed Weights   02/19/22 0007  Weight: (!) 150 kg    Examination:  General exam: Appears calm and comfortable, with is Respiratory system: Clear to auscultation. Respiratory effort normal. Cardiovascular system: S1 & S2 heard, RRR. No JVD, murmurs, rubs, gallops or clicks. No pedal edema. Gastrointestinal system: Abdomen is nondistended, soft and nontender. No organomegaly or masses felt. Normal bowel sounds heard. Central nervous system: Alert and oriented. No focal neurological deficits. Extremities: Symmetric 5 x 5 power. Skin: Dressing on the left heel, no open sore on the left heel.  No erythema. Psychiatry: Judgement and insight appear normal. Mood & affect appropriate.    Data Reviewed: I have personally reviewed following labs and imaging studies  CBC: Recent Labs  Lab 02/18/22 1506 02/19/22 0052  WBC 14.6* 11.8*  NEUTROABS 11.6*  --   HGB 13.3 11.6*  HCT 40.9 36.7  MCV 85.9 88.0  PLT 286 [id number]   Basic Metabolic Panel: Recent Labs  Lab 02/18/22 1506  NA 139  K 3.6  CL 103  CO2 28  GLUCOSE 106*  BUN 13  CREATININE 0.75  CALCIUM 8.8*   GFR: Estimated Creatinine Clearance: 90.4 mL/min (by C-G formula based on SCr of 0.75 mg/dL). Liver Function Tests: Recent Labs  Lab 02/18/22 1506  AST 35  ALT 29  ALKPHOS 63  BILITOT 0.7  PROT 7.6  ALBUMIN 3.5   No results for input(s): "LIPASE", "AMYLASE" in the last 168 hours. No results for input(s): "AMMONIA" in the last 168  hours. Coagulation Profile: No results for input(s): "INR", "PROTIME" in the last 168 hours. Cardiac Enzymes: No results for input(s): "CKTOTAL", "CKMB", "CKMBINDEX", "TROPONINI" in the last 168 hours. BNP (last 3 results) No results for input(s): "PROBNP" in the last 8760 hours. HbA1C: No results for input(s): "HGBA1C" in the last 72 hours. CBG: No results for input(s): "GLUCAP" in the last 168 hours. Lipid Profile: No results for input(s): "CHOL", "HDL", "LDLCALC", "TRIG", "CHOLHDL", "LDLDIRECT" in the last 72 hours. Thyroid Function Tests: No results for input(s): "TSH", "T4TOTAL", "FREET4", "T3FREE", "THYROIDAB" in the last 72 hours. Anemia Panel: No results for input(s): "VITAMINB12", "FOLATE", "FERRITIN", "TIBC", "IRON", "RETICCTPCT" in the last 72 hours. Sepsis Labs: Recent Labs  Lab 02/18/22 2255 02/19/22 0052  LATICACIDVEN 1.4 1.5  No results found for this or any previous visit (from the past 240 hour(s)).   Radiology Studies: VAS [place] LOWER EXTREMITY VENOUS (DVT) (7a-7p)  Result Date: 02/18/2022  Lower Venous DVT Study Patient Name:  JOMARI EVEN  Date of Exam:   02/18/2022 Medical Rec #: [id number]          Accession #:    [id number] Date of Birth: 1945/11/24          Patient Gender: F Patient Age:   57 years Exam Location:  [facility] Procedure:      VAS [place] LOWER EXTREMITY VENOUS (DVT) Referring Phys: COOPER ROBBINS --------------------------------------------------------------------------------  Indications: Pain.  Limitations: Body habitus. Comparison Study: No previous exam noted. Performing Technologist: Bobetta Lime BS, RVT  Examination Guidelines: A complete evaluation includes B-mode imaging, spectral Doppler, color Doppler, and power Doppler as needed of all accessible portions of each vessel. Bilateral testing is considered an integral part of a complete examination. Limited examinations for reoccurring indications may be performed as noted. The reflux  portion of the exam is performed with the patient in reverse Trendelenburg.  +---------+---------------+---------+-----------+----------+-------------------+ RIGHT    CompressibilityPhasicitySpontaneityPropertiesThrombus Aging      +---------+---------------+---------+-----------+----------+-------------------+ CFV      Full           Yes      Yes                                      +---------+---------------+---------+-----------+----------+-------------------+ SFJ      Full                                                             +---------+---------------+---------+-----------+----------+-------------------+ FV Prox  Full                                                             +---------+---------------+---------+-----------+----------+-------------------+ FV Mid   Full                                         Not well visualized +---------+---------------+---------+-----------+----------+-------------------+ FV DistalFull                                         Not well visualized +---------+---------------+---------+-----------+----------+-------------------+ PFV      Full                                                             +---------+---------------+---------+-----------+----------+-------------------+ POP      Full           Yes      Yes                                      +---------+---------------+---------+-----------+----------+-------------------+  PTV                                                   Not well visualized +---------+---------------+---------+-----------+----------+-------------------+ PERO                                                  Not well visualized +---------+---------------+---------+-----------+----------+-------------------+   Right Technical Findings: The right mid and distal femoral vein segments were not well visualized by B-mode secondary to body habitus, however they appear patent by  color imaging. The right calf veins were not well visualized with B-mode or color imaging.  +---------+---------------+---------+-----------+----------+-------------------+ LEFT     CompressibilityPhasicitySpontaneityPropertiesThrombus Aging      +---------+---------------+---------+-----------+----------+-------------------+ CFV      Full           Yes      Yes                                      +---------+---------------+---------+-----------+----------+-------------------+ SFJ      Full                                                             +---------+---------------+---------+-----------+----------+-------------------+ FV Prox  Full                                                             +---------+---------------+---------+-----------+----------+-------------------+ FV Mid                                                Not well visualized +---------+---------------+---------+-----------+----------+-------------------+ FV Distal                                             Not well visualized +---------+---------------+---------+-----------+----------+-------------------+ PFV      Full                                                             +---------+---------------+---------+-----------+----------+-------------------+ POP      Full           Yes      Yes                                      +---------+---------------+---------+-----------+----------+-------------------+ PTV  Not well visualized +---------+---------------+---------+-----------+----------+-------------------+ PERO                                                  Not well visualized +---------+---------------+---------+-----------+----------+-------------------+   Left Technical Findings: The left mid and distal femoral vein segments were not well visualized by B-mode secondary to body habitus, however they appear  patent by color imaging. The left calf veins were not well visualized with B-mode or color imaging.   Summary: BILATERAL: - No evidence of deep vein thrombosis seen in the lower extremities, bilaterally. -No evidence of popliteal cyst, bilaterally. RIGHT: - Portions of this examination were limited- see technologist comments above.  LEFT: - Portions of this examination were limited- see technologist comments above.  *See table(s) above for measurements and observations.    Preliminary    DG Foot Complete Left  Result Date: 02/18/2022 CLINICAL DATA:  Ongoing left heel pain. EXAM: LEFT FOOT - COMPLETE 3+ VIEW COMPARISON:  None Available. FINDINGS: Diffuse soft tissue swelling, most pronounced dorsally. Moderately large posterior and inferior calcaneal enthesophytes. No fractures, bone destruction or periosteal reaction seen. No soft tissue gas. IMPRESSION: 1. Moderately large posterior and inferior calcaneal enthesophytes. 2. Diffuse soft tissue swelling, most pronounced dorsally. Electronically Signed   By: Claudie Revering M.D.   On: 02/18/2022 16:23    Scheduled Meds:  furosemide  40 mg Intravenous Daily   mupirocin cream   Topical Daily   Continuous Infusions:   LOS: 0 days   Darliss Cheney, MD [facility]  02/19/2022, 1:04 PM   *Please note that this is a verbal dictation therefore any spelling or grammatical errors are due to the "[employer]" system interpretation.  Please page via [place] and do not message via secure chat for urgent patient care matters. Secure chat can be used for non urgent patient care matters.  How to contact the [facility] Attending or Consulting provider [place] or covering provider during after hours [place], for this patient?  Check the care team in [facility] and look for a) attending/consulting [facility] provider listed and b) the [facility] team listed. Page or secure chat 7A-7P. Log into [email or web address] and use [place]'s universal password to access. If you do not have the  password, please contact the hospital operator. Locate the [facility] provider you are looking for under [facility] and page to a number that you can be directly reached. If you still have difficulty reaching the provider, please page the [facility] (Director on Call) for the Hospitalists listed on amion for assistance.

## 2022-02-20 DIAGNOSIS — M79672 Pain in left foot: Secondary | ICD-10-CM | POA: Diagnosis not present

## 2022-02-20 LAB — CBC WITH DIFFERENTIAL/PLATELET
Abs Immature Granulocytes: 0.05 10*3/uL (ref 0.00–0.07)
Basophils Absolute: 0.1 10*3/uL (ref 0.0–0.1)
Basophils Relative: 1 %
Eosinophils Absolute: 0.3 10*3/uL (ref 0.0–0.5)
Eosinophils Relative: 3 %
HCT: 36 % (ref 36.0–46.0)
Hemoglobin: 11.2 g/dL — ABNORMAL LOW (ref 12.0–15.0)
Immature Granulocytes: 1 %
Lymphocytes Relative: 18 %
Lymphs Abs: 1.8 10*3/uL (ref 0.7–4.0)
MCH: 27.8 pg (ref 26.0–34.0)
MCHC: 31.1 g/dL (ref 30.0–36.0)
MCV: 89.3 fL (ref 80.0–100.0)
Monocytes Absolute: 1 10*3/uL (ref 0.1–1.0)
Monocytes Relative: 10 %
Neutro Abs: 6.7 10*3/uL (ref 1.7–7.7)
Neutrophils Relative %: 67 %
Platelets: 251 10*3/uL (ref 150–400)
RBC: 4.03 MIL/uL (ref 3.87–5.11)
RDW: 15.8 % — ABNORMAL HIGH (ref 11.5–15.5)
WBC: 9.9 10*3/uL (ref 4.0–10.5)
nRBC: 0 % (ref 0.0–0.2)

## 2022-02-20 LAB — PROCALCITONIN: Procalcitonin: <0.1 ng/mL

## 2022-02-20 MED ORDER — METOPROLOL TARTRATE 5 MG/5ML IV SOLN
2.5000 mg | INTRAVENOUS | Status: DC | PRN
  Administered 2022-02-20 – 2022-02-21 (×2): 2.5 mg via INTRAVENOUS
  Filled 2022-02-20 (×2): qty 5

## 2022-02-20 MED ORDER — SODIUM CHLORIDE 0.9 % IV SOLN
1.0000 g | INTRAVENOUS | Status: DC
  Administered 2022-02-20 – 2022-02-24 (×5): 1 g via INTRAVENOUS
  Filled 2022-02-20 (×5): qty 10

## 2022-02-20 NOTE — Plan of Care (Signed)
  Problem: Education: Goal: Knowledge of General Education information will improve Description: Including pain rating scale, medication(s)/side effects and non-pharmacologic comfort measures Outcome: Progressing   Problem: Clinical Measurements: Goal: Ability to maintain clinical measurements within normal limits will improve Outcome: Progressing   Problem: Activity: Goal: Risk for activity intolerance will decrease Outcome: Not Progressing   

## 2022-02-20 NOTE — Evaluation (Signed)
Occupational Therapy Evaluation Patient Details Name: Sandra [name] MRN: [id number] DOB: 1945/08/27 Today's Date: 02/20/2022   History of Present Illness Patient is a 76 year old female with medical history significant of hypertension presented to the ED complaining of left foot/heel pain and bilateral leg swelling.  EMS reported patient having bedbugs.  Tachycardic and hypertensive on arrival to the ED. X-ray of left foot moderately large posterior and inferior calcaneal enthesophytes and diffuse soft tissue swelling most pronounced dorsally.  X-ray negative for osteomyelitis.  MRI of left foot showed Skin thickening and marked subcutaneous soft tissue edema about  the ankle/foot consistent with severe cellulitis, and achilles tendinosis.  Bilateral lower extremity Dopplers negative for DVT.   Clinical Impression   Patient is a 76 year old female who was admitted for above.  Patient was living at motel alone with PRN family support. Patient was unable to stand up on LLE with increased pain impacting participation. Patient was noted to have decreased functional activity tolerance, decreased endurance, decreased standing balance, decreased safety awareness, and decreased knowledge of AD/AE impacting participation in ADLs.  Patient would continue to benefit from skilled OT services at this time while admitted and after d/c to address noted deficits in order to improve overall safety and independence in ADLs.       Recommendations for follow up therapy are one component of a multi-disciplinary discharge planning process, led by the attending physician.  Recommendations may be updated based on patient status, additional functional criteria and insurance authorization.   Follow Up Recommendations  Skilled nursing-short term rehab (<3 hours/day)    Assistance Recommended at Discharge Frequent or constant Supervision/Assistance  Patient can return home with the following Two people to help with  walking and/or transfers;Two people to help with bathing/dressing/bathroom;Direct supervision/assist for medications management;Help with stairs or ramp for entrance;Assist for transportation;Direct supervision/assist for financial management;Assistance with cooking/housework    Functional Status Assessment  Patient has had a recent decline in their functional status and demonstrates the ability to make significant improvements in function in a reasonable and predictable amount of time.  Equipment Recommendations  BSC/3in1 (bariatric BSC)    Recommendations for Other Services       Precautions / Restrictions Precautions Precautions: Fall Precaution Comments: denies falls in past 6 months Restrictions Weight Bearing Restrictions: No      Mobility Bed Mobility Overal bed mobility: Needs Assistance Bed Mobility: Sit to Supine       Sit to supine: Mod assist   General bed mobility comments: physical assist to get BLE back into bed.    Transfers                          Balance Overall balance assessment: Needs assistance Sitting-balance support: Feet supported, No upper extremity supported Sitting balance-Leahy Scale: Good         Standing balance comment: unable to test                           ADL either performed or assessed with clinical judgement   ADL Overall ADL's : Needs assistance/impaired Eating/Feeding: Set up;Sitting Eating/Feeding Details (indicate cue type and reason): EOB Grooming: Set up;Sitting Grooming Details (indicate cue type and reason): EOB patient reported having completed all tasks earlier this AM Upper Body Bathing: Minimal assistance;Sitting   Lower Body Bathing: Total assistance;Bed level Lower Body Bathing Details (indicate cue type and reason): patient needed +2 for  holdign BLE apart while assisting with hygiene tasks. Upper Body Dressing : Minimal assistance;Sitting   Lower Body Dressing: Total assistance;Bed  level   Toilet Transfer: +2 for physical assistance;+2 for safety/equipment Toilet Transfer Details (indicate cue type and reason): patient was unable to stand on LLE with increase pain at this time. patient was able to scoot with max A and guarding to R side to head of bed and backwards in bed to prevent sliding off. patient appeared to have decreased insight to positioning on bed with reports that she was sliding off bed with over 80% on thighs on bed. Toileting- Clothing Manipulation and Hygiene: Total assistance;Bed level               Vision Baseline Vision/History: 1 Wears glasses Patient Visual Report: No change from baseline       Perception     Praxis      Pertinent Vitals/Pain Pain Assessment Pain Assessment: Faces Faces Pain Scale: Hurts whole lot Pain Location: L foot with movement Pain Descriptors / Indicators: Grimacing, Guarding Pain Intervention(s): Limited activity within patient's tolerance, Monitored during session     Hand Dominance     Extremity/Trunk Assessment Upper Extremity Assessment Upper Extremity Assessment: Overall WFL for tasks assessed   Lower Extremity Assessment Lower Extremity Assessment: Defer to PT evaluation ("Skin thickening and marked subcutaneous soft tissue edema about  the ankle/foot consistent with severe cellulitis. No abnormal  enhancement to suggest abscess or fluid collection.     2. 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" MRI) LLE Deficits / Details: knee ext 4/5, ankle DF/PF AROM grossly WFL, painful with movement LLE Sensation: WNL   Cervical / Trunk Assessment Cervical / Trunk Assessment: Normal   Communication Communication Communication: No difficulties   Cognition Arousal/Alertness: Awake/alert Behavior During Therapy: WFL for tasks assessed/performed Overall Cognitive Status: Within Functional Limits for tasks assessed                                       General Comments        Exercises     Shoulder Instructions      Home Living Family/patient expects to be discharged to:: Other (Comment) (motel) Living Arrangements: Alone Available Help at Discharge: Available PRN/intermittently;Family Type of Home: Other(Comment) (motel) Home Access: Level entry     Home Layout: One level     Bathroom Shower/Tub: Tub/shower unit         Home Equipment: Agricultural consultant (2 wheels);Cane - single point   Additional Comments: son and granddaughter only available in evenings      Prior Functioning/Environment               Mobility Comments: walked without AD at baseline, used RW prior to admission here, walking was becoming more difficult due to L foot pain, granddaughter was having to physically assist with transfer; denies falls in past 6 months ADLs Comments: was sponge bathing PTA        OT Problem List: Obesity;Decreased activity tolerance;Impaired balance (sitting and/or standing);Decreased safety awareness;Decreased knowledge of precautions;Decreased knowledge of use of DME or AE;Cardiopulmonary status limiting activity;Pain      OT Treatment/Interventions: Therapeutic exercise;Self-care/ADL training;Neuromuscular education;Energy conservation;Therapeutic activities;DME and/or AE instruction;Balance training;Patient/family education    OT Goals(Current goals can be found in the care plan section) Acute Rehab OT Goals Patient Stated Goal: to get LLE better to get back home OT Goal Formulation: With patient  Time For Goal Achievement: 03/06/22 Potential to Achieve Goals: Fair  OT Frequency: Min 2X/week    Co-evaluation              AM-PAC OT "6 Clicks" Daily Activity     Outcome Measure Help from another person eating meals?: A Little Help from another person taking care of personal grooming?: A Little Help from another person toileting, which includes using toliet, bedpan, or urinal?: Total Help from another person bathing (including  washing, rinsing, drying)?: A Lot Help from another person to put on and taking off regular upper body clothing?: A Little Help from another person to put on and taking off regular lower body clothing?: Total 6 Click Score: 13   End of Session Nurse Communication: Other (comment) (nurse in room to assist with bed change)  Activity Tolerance: Patient tolerated treatment well Patient left: in bed;with call bell/phone within reach;with nursing/sitter in room  OT Visit Diagnosis: Unsteadiness on feet (R26.81);Other abnormalities of gait and mobility (R26.89);Muscle weakness (generalized) (M62.81);Pain Pain - Right/Left: Left Pain - part of body: Leg                Time: 1122-1210 OT Time Calculation (min): 48 min Charges:  OT General Charges $OT Visit: 1 Visit OT Evaluation $OT Eval Moderate Complexity: 1 Mod OT Treatments $Self Care/Home Management : 23-37 mins  Sharyn Blitz OTR/L, MS Acute Rehabilitation Department Office# [phone] Pager# [phone]   Ardyth Harps 02/20/2022, 1:27 PM

## 2022-02-20 NOTE — Progress Notes (Signed)
PROGRESS NOTE    [name]  [id number] DOB: 11/06/1945 DOA: 02/18/2022 PCP: Patient, No Pcp Per   Brief Narrative:  HPI: Sandra [name] is a 76 y.o. female with medical history significant of hypertension presented to the ED complaining of left foot/heel pain and bilateral leg swelling.  EMS reported patient having bedbugs.  Tachycardic and hypertensive on arrival to the ED.  Afebrile.  Labs significant for WBC 14.6, lactic acid pending.  X-ray of left foot moderately large posterior and inferior calcaneal enthesophytes and diffuse soft tissue swelling most pronounced dorsally.  X-ray negative for osteomyelitis.  MRI of left foot pending.  Bilateral lower extremity Dopplers negative for DVT. Patient was given ibuprofen.   Patient states about 2 months ago she noticed an abrasion on her left heel which she thinks is due to wearing plastic slippers.  States over time a callus formed in this area and then the skin was peeling off.  She has been soaking this foot in water and hydrogen peroxide and applying petroleum jelly.  She does report bilateral lower extremity edema for several months but believes lately the left foot has been more swollen.  She has not been able to walk much as she experiences significant pain every time she steps on her left foot.  Denies any any open cuts or drainage from this area.  She sits in a chair most of the day and believes that is contributing to her bilateral lower extremity edema as she does not elevate her legs.  She reports history of hypertension but has been off of medications since 2009.  Denies any other medical problems.  Denies fevers or chills.  Denies cough, shortness of breath, or chest pain.  No other complaints.  Assessment & Plan:   Principal Problem:   Pain of left heel Active Problems:   Essential hypertension   Bilateral lower extremity edema  Left heel pain: No open sores.  X-ray negative for osteomyelitis.  No cellulitis on  physical examination or on the x-ray however MRI of the foot shows severe edema of the skin with severe cellulitis.  Osteomyelitis ruled out.  We will start her on Rocephin and transition to Keflex at the time of discharge.  Will check procalcitonin.   Hypertension, essential: Blood pressure elevated on arrival to the ED and not on any antihypertensives at home.  Started on as needed hydralazine.  Blood pressure improving.  Monitor closely.  Bilateral lower extremity edema/chronic grade 1 diastolic dysfunction: Patient tells me that she has chronic lymphedema and in fact her edema is better today than it usually is.  No previous history of congestive heart failure, no echo in the records.  Echo shows normal ejection fraction but grade 1 diastolic dysfunction.  Continue oral Lasix  Bedbugs infestation: Reportedly, bedbugs were seen by EMT when she was picked up but none has been seen here.  She is maintained on contact isolation.  Morbid obesity: Weight loss and dietary modification has been counseled.  Generalized deconditioning/debility: Seen by PT OT, she lives alone in the motel, no family support, PT recommends SNF, I have consulted TOC.  DVT prophylaxis: SCDs Start: 02/18/22 2051   Code Status: Full Code  Family Communication:  None present at bedside.  Plan of care discussed with patient in length and he/she verbalized understanding and agreed with it.  Status is: Inpatient Remains inpatient appropriate because: MRI foot pending.   Estimated body mass index is 58.58 kg/m as calculated from the following:  Height as of this encounter: 5\' 3"  (1.6 m).   Weight as of this encounter: 150 kg.    Nutritional Assessment: Body mass index is 58.58 kg/m.Marland Kitchen Seen by dietician.  I agree with the assessment and plan as outlined below: Nutrition Status:        . Skin Assessment: I have examined the patient's skin and I agree with the wound assessment as performed by the wound care RN as  outlined below:    Consultants:  None  Procedures:  None  Antimicrobials:  Anti-infectives (From admission, onward)    Start     Dose/Rate Route Frequency Ordered Stop   02/20/22 1145  cefTRIAXone (ROCEPHIN) 1 g in sodium chloride 0.9 % 100 mL IVPB        1 g 200 mL/hr over 30 Minutes Intravenous Every 24 hours 02/20/22 1059 02/27/22 1144         Subjective:  Patient seen and examined.  No new complaint other than foot pain which is acute on chronic.  She is happy that her edema is improving.  Objective: Vitals:   02/19/22 2301 02/20/22 0145 02/20/22 0528 02/20/22 0831  BP: (!) 140/62 (!) 126/55 (!) 118/58 (!) 141/57  Pulse: (!) 110 (!) 103 91 95  Resp:  17 20 18   Temp: 98.4 F (36.9 C) 98.2 F (36.8 C) 98.7 F (37.1 C) 98.5 F (36.9 C)  TempSrc: Oral Oral Oral Oral  SpO2: 97% 97% 97% 97%  Weight:      Height:        Intake/Output Summary (Last 24 hours) at 02/20/2022 1059 Last data filed at 02/20/2022 0542 Gross per 24 hour  Intake 440 ml  Output 2500 ml  Net -2060 ml    Filed Weights   02/19/22 0007  Weight: (!) 150 kg    Examination:  General exam: Appears calm and comfortable, obese Respiratory system: Clear to auscultation. Respiratory effort normal. Cardiovascular system: S1 & S2 heard, RRR. No JVD, murmurs, rubs, gallops or clicks.  By lateral lower extremity moderate lymphedema. Gastrointestinal system: Abdomen is nondistended, soft and nontender. No organomegaly or masses felt. Normal bowel sounds heard. Central nervous system: Alert and oriented. No focal neurological deficits. Extremities: Symmetric 5 x 5 power. Skin: No rashes, lesions or ulcers.  Psychiatry: Judgement and insight appear normal. Mood & affect appropriate.     Data Reviewed: I have personally reviewed following labs and imaging studies  CBC: Recent Labs  Lab 02/18/22 1506 02/19/22 0052 02/20/22 0831  WBC 14.6* 11.8* 9.9  NEUTROABS 11.6*  --  6.7  HGB 13.3 11.6*  11.2*  HCT 40.9 36.7 36.0  MCV 85.9 88.0 89.3  PLT 286 241 [id number]    Basic Metabolic Panel: Recent Labs  Lab 02/18/22 1506  NA 139  K 3.6  CL 103  CO2 28  GLUCOSE 106*  BUN 13  CREATININE 0.75  CALCIUM 8.8*    GFR: Estimated Creatinine Clearance: 90.4 mL/min (by C-G formula based on SCr of 0.75 mg/dL). Liver Function Tests: Recent Labs  Lab 02/18/22 1506  AST 35  ALT 29  ALKPHOS 63  BILITOT 0.7  PROT 7.6  ALBUMIN 3.5    No results for input(s): "LIPASE", "AMYLASE" in the last 168 hours. No results for input(s): "AMMONIA" in the last 168 hours. Coagulation Profile: No results for input(s): "INR", "PROTIME" in the last 168 hours. Cardiac Enzymes: No results for input(s): "CKTOTAL", "CKMB", "CKMBINDEX", "TROPONINI" in the last 168 hours. BNP (last 3 results) No results  for input(s): "PROBNP" in the last 8760 hours. HbA1C: No results for input(s): "HGBA1C" in the last 72 hours. CBG: No results for input(s): "GLUCAP" in the last 168 hours. Lipid Profile: No results for input(s): "CHOL", "HDL", "LDLCALC", "TRIG", "CHOLHDL", "LDLDIRECT" in the last 72 hours. Thyroid Function Tests: No results for input(s): "TSH", "T4TOTAL", "FREET4", "T3FREE", "THYROIDAB" in the last 72 hours. Anemia Panel: No results for input(s): "VITAMINB12", "FOLATE", "FERRITIN", "TIBC", "IRON", "RETICCTPCT" in the last 72 hours. Sepsis Labs: Recent Labs  Lab 02/18/22 2255 02/19/22 0052  LATICACIDVEN 1.4 1.5     No results found for this or any previous visit (from the past 240 hour(s)).   Radiology Studies: MR FOOT LEFT W WO CONTRAST  Result Date: 02/19/2022 CLINICAL DATA:  Callus on bottom of the heel. Marked soft tissue swelling. EXAM: MRI OF THE LEFT HINDFOOT WITHOUT AND WITH CONTRAST TECHNIQUE: Multiplanar, multisequence MR imaging of the left was performed both before and after administration of intravenous contrast. CONTRAST:  61mL GADAVIST GADOBUTROL 1 MMOL/ML IV SOLN COMPARISON:   None Available. FINDINGS: Bones/Joint/Cartilage Bone marrow signal is within normal limits. No evidence of osteomyelitis. No fracture or dislocation. No joint effusion. Ligaments Ligaments of the medial and lateral aspect of the ankle are within normal limits. Muscles and Tendons Muscles are normal in bulk. Increased intramuscular signal of the plantar muscles secondary to myopathy/myositis. Mild thickening of the Achilles tendon suggesting tendinosis without discrete tear. Tendons of the flexor, extensor and peroneal compartments appear intact. Soft tissues There is marked subcutaneous soft tissue edema and skin thickening consistent with severe cellulitis prominent about the dorsum and posterior aspect of the knee. IMPRESSION: 1. Skin thickening and marked subcutaneous soft tissue edema about the ankle/foot consistent with severe cellulitis. No abnormal enhancement to suggest abscess or fluid collection. 2. 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 The remaining muscles and tendons appear maintained. No intramuscular fluid collection. 3. Marrow signal is within normal limits. No evidence of osteomyelitis or fracture/dislocation. Electronically Signed   By: Larose Hires D.O.   On: 02/19/2022 22:55   ECHOCARDIOGRAM COMPLETE  Result Date: 02/19/2022    ECHOCARDIOGRAM REPORT   Patient Name:   ANUM PALECEK Date of Exam: 02/19/2022 Medical Rec #:  [id number]         Height:       63.0 in Accession #:    [id number]        Weight:       330.7 lb Date of Birth:  1946/05/18         BSA:          2.394 m Patient Age:    75 years          BP:           127/58 mmHg Patient Gender: F                 HR:           92 bpm. Exam Location:  Inpatient Procedure: 2D Echo, Cardiac Doppler, Color Doppler and Intracardiac            Opacification Agent Indications:    Congestive Heart Failure I50.9  History:        Patient has no prior history of Echocardiogram examinations.                 Risk Factors:Hypertension.   Sonographer:    Milda Smart Referring Phys: [id number] Blue Winther  Sonographer Comments: Suboptimal subcostal window, suboptimal parasternal window and  suboptimal apical window. Image acquisition challenging due to patient body habitus and Image acquisition challenging due to respiratory motion. IMPRESSIONS  1. Left ventricular ejection fraction, by estimation, is 60 to 65%. The left ventricle has normal function. The left ventricle has no regional wall motion abnormalities. There is mild left ventricular hypertrophy. Left ventricular diastolic parameters are consistent with Grade I diastolic dysfunction (impaired relaxation).  2. Right ventricular systolic function is normal. The right ventricular size is normal.  3. Mild-mod MV stenosis, gradient 6.0 mmHg HR 92 bpm. The mitral valve is abnormal. No evidence of mitral valve regurgitation.  4. The aortic valve was not well visualized. Aortic valve regurgitation is not visualized. Conclusion(s)/Recommendation(s): There's an increase MV gradient with echodensity on the mitral valve. Resolution is challenging with TTE. Recommend TEE. FINDINGS  Left Ventricle: Left ventricular ejection fraction, by estimation, is 60 to 65%. The left ventricle has normal function. The left ventricle has no regional wall motion abnormalities. Definity contrast agent was given IV to delineate the left ventricular  endocardial borders. The left ventricular internal cavity size was normal in size. There is mild left ventricular hypertrophy. Left ventricular diastolic parameters are consistent with Grade I diastolic dysfunction (impaired relaxation). Right Ventricle: The right ventricular size is normal. Right ventricular systolic function is normal. Left Atrium: Left atrial size was normal in size. Right Atrium: Right atrial size was normal in size. Pericardium: The pericardium was not well visualized. Mitral Valve: Mild-mod MV stenosis, gradient 6.0 mmHg HR 92 bpm. The mitral valve is  abnormal. No evidence of mitral valve regurgitation. MV peak gradient, 10.1 mmHg. The mean mitral valve gradient is 6.0 mmHg. Tricuspid Valve: Tricuspid valve regurgitation is not demonstrated. Aortic Valve: The aortic valve was not well visualized. Aortic valve regurgitation is not visualized. Aortic valve mean gradient measures 7.0 mmHg. Aortic valve peak gradient measures 12.2 mmHg. Aortic valve area, by VTI measures 3.01 cm. Pulmonic Valve: Pulmonic valve regurgitation is not visualized. Aorta: The aortic root is normal in size and structure.  LEFT VENTRICLE PLAX 2D LVIDd:         3.80 cm   Diastology LVIDs:         2.70 cm   LV e' medial:    7.72 cm/s LV PW:         1.10 cm   LV E/e' medial:  18.5 LV IVS:        1.10 cm   LV e' lateral:   5.98 cm/s LVOT diam:     2.00 cm   LV E/e' lateral: 23.9 LV SV:         93 LV SV Index:   39 LVOT Area:     3.14 cm  RIGHT VENTRICLE RV S prime:     13.30 cm/s TAPSE (M-mode): 1.8 cm LEFT ATRIUM             Index        RIGHT ATRIUM          Index LA diam:        3.80 cm 1.59 cm/m   RA Area:     9.35 cm LA Vol (A2C):   53.4 ml 22.30 ml/m  RA Volume:   16.60 ml 6.93 ml/m LA Vol (A4C):   49.9 ml 20.84 ml/m LA Biplane Vol: 52.0 ml 21.72 ml/m  AORTIC VALVE AV Area (Vmax):    2.76 cm AV Area (Vmean):   2.71 cm AV Area (VTI):     3.01 cm AV Vmax:  175.00 cm/s AV Vmean:          130.000 cm/s AV VTI:            0.309 m AV Peak Grad:      12.2 mmHg AV Mean Grad:      7.0 mmHg LVOT Vmax:         154.00 cm/s LVOT Vmean:        112.000 cm/s LVOT VTI:          0.296 m LVOT/AV VTI ratio: 0.96  AORTA Ao Root diam: 2.90 cm MITRAL VALVE MV Area (PHT): 2.07 cm     SHUNTS MV Area VTI:   2.61 cm     Systemic VTI:  0.30 m MV Peak grad:  10.1 mmHg    Systemic Diam: 2.00 cm MV Mean grad:  6.0 mmHg MV Vmax:       1.59 m/s MV Vmean:      121.0 cm/s MV Decel Time: 366 msec MV E velocity: 143.00 cm/s MV A velocity: 158.00 cm/s MV E/A ratio:  0.91 Mary Scientist, physiological signed  by Phineas Inches Signature Date/Time: 02/19/2022/4:02:43 PM    Final    VAS [place] LOWER EXTREMITY VENOUS (DVT) (7a-7p)  Result Date: 02/18/2022  Lower Venous DVT Study Patient Name:  LATISSHA SMOLINSKI  Date of Exam:   02/18/2022 Medical Rec #: [id number]          Accession #:    [id number] Date of Birth: Apr 21, 1946          Patient Gender: F Patient Age:   25 years Exam Location:  [facility] Procedure:      VAS [place] LOWER EXTREMITY VENOUS (DVT) Referring Phys: COOPER ROBBINS --------------------------------------------------------------------------------  Indications: Pain.  Limitations: Body habitus. Comparison Study: No previous exam noted. Performing Technologist: Bobetta Lime BS, RVT  Examination Guidelines: A complete evaluation includes B-mode imaging, spectral Doppler, color Doppler, and power Doppler as needed of all accessible portions of each vessel. Bilateral testing is considered an integral part of a complete examination. Limited examinations for reoccurring indications may be performed as noted. The reflux portion of the exam is performed with the patient in reverse Trendelenburg.  +---------+---------------+---------+-----------+----------+-------------------+ RIGHT    CompressibilityPhasicitySpontaneityPropertiesThrombus Aging      +---------+---------------+---------+-----------+----------+-------------------+ CFV      Full           Yes      Yes                                      +---------+---------------+---------+-----------+----------+-------------------+ SFJ      Full                                                             +---------+---------------+---------+-----------+----------+-------------------+ FV Prox  Full                                                             +---------+---------------+---------+-----------+----------+-------------------+ FV Mid   Full  Not well visualized  +---------+---------------+---------+-----------+----------+-------------------+ FV DistalFull                                         Not well visualized +---------+---------------+---------+-----------+----------+-------------------+ PFV      Full                                                             +---------+---------------+---------+-----------+----------+-------------------+ POP      Full           Yes      Yes                                      +---------+---------------+---------+-----------+----------+-------------------+ PTV                                                   Not well visualized +---------+---------------+---------+-----------+----------+-------------------+ PERO                                                  Not well visualized +---------+---------------+---------+-----------+----------+-------------------+   Right Technical Findings: The right mid and distal femoral vein segments were not well visualized by B-mode secondary to body habitus, however they appear patent by color imaging. The right calf veins were not well visualized with B-mode or color imaging.  +---------+---------------+---------+-----------+----------+-------------------+ LEFT     CompressibilityPhasicitySpontaneityPropertiesThrombus Aging      +---------+---------------+---------+-----------+----------+-------------------+ CFV      Full           Yes      Yes                                      +---------+---------------+---------+-----------+----------+-------------------+ SFJ      Full                                                             +---------+---------------+---------+-----------+----------+-------------------+ FV Prox  Full                                                             +---------+---------------+---------+-----------+----------+-------------------+ FV Mid                                                Not well  visualized +---------+---------------+---------+-----------+----------+-------------------+ FV Distal  Not well visualized +---------+---------------+---------+-----------+----------+-------------------+ PFV      Full                                                             +---------+---------------+---------+-----------+----------+-------------------+ POP      Full           Yes      Yes                                      +---------+---------------+---------+-----------+----------+-------------------+ PTV                                                   Not well visualized +---------+---------------+---------+-----------+----------+-------------------+ PERO                                                  Not well visualized +---------+---------------+---------+-----------+----------+-------------------+   Left Technical Findings: The left mid and distal femoral vein segments were not well visualized by B-mode secondary to body habitus, however they appear patent by color imaging. The left calf veins were not well visualized with B-mode or color imaging.   Summary: BILATERAL: - No evidence of deep vein thrombosis seen in the lower extremities, bilaterally. -No evidence of popliteal cyst, bilaterally. RIGHT: - Portions of this examination were limited- see technologist comments above.  LEFT: - Portions of this examination were limited- see technologist comments above.  *See table(s) above for measurements and observations.    Preliminary    DG Foot Complete Left  Result Date: 02/18/2022 CLINICAL DATA:  Ongoing left heel pain. EXAM: LEFT FOOT - COMPLETE 3+ VIEW COMPARISON:  None Available. FINDINGS: Diffuse soft tissue swelling, most pronounced dorsally. Moderately large posterior and inferior calcaneal enthesophytes. No fractures, bone destruction or periosteal reaction seen. No soft tissue gas. IMPRESSION: 1. Moderately large  posterior and inferior calcaneal enthesophytes. 2. Diffuse soft tissue swelling, most pronounced dorsally. Electronically Signed   By: Claudie Revering M.D.   On: 02/18/2022 16:23    Scheduled Meds:  enoxaparin (LOVENOX) injection  80 mg Subcutaneous Daily   furosemide  40 mg Intravenous Daily   metoprolol tartrate  5 mg Intravenous Once   mupirocin cream   Topical Daily   Continuous Infusions:  cefTRIAXone (ROCEPHIN)  IV       LOS: 1 day   Darliss Cheney, MD [facility]  02/20/2022, 10:59 AM   *Please note that this is a verbal dictation therefore any spelling or grammatical errors are due to the "[place] [employer]" system interpretation.  Please page via [place] and do not message via secure chat for urgent patient care matters. Secure chat can be used for non urgent patient care matters.  How to contact the [facility] Attending or Consulting provider [place] or covering provider during after hours [place], for this patient?  Check the care team in [facility] and look for a) attending/consulting [facility] provider listed and b) the [facility] team listed.  Page or secure chat [phone]-[id number]. Log into [email or web address] and use [place] universal password to access. If you do not have the password, please contact the hospital operator. Locate the [facility] provider you are looking for under [facility] and page to a number that you can be directly reached. If you still have difficulty reaching the provider, please page the [facility] (Director on Call) for the Hospitalists listed on amion for assistance.

## 2022-02-20 NOTE — Evaluation (Signed)
Physical Therapy Evaluation Patient Details Name: Sandra [name] MRN: [id number] DOB: [birth year] Today's Date: 02/20/2022  History of Present Illness  76 y.o. female with medical history significant of hypertension presented to the ED complaining of left foot/heel pain and bilateral leg swelling.  EMS reported patient having bedbugs.  Tachycardic and hypertensive on arrival to the ED.  Afebrile.  Labs significant for WBC 14.6, lactic acid pending.  X-ray of left foot moderately large posterior and inferior calcaneal enthesophytes and diffuse soft tissue swelling most pronounced dorsally.  X-ray negative for osteomyelitis.  MRI of left foot showed Skin thickening and marked subcutaneous soft tissue edema about  the ankle/foot consistent with severe cellulitis, and achilles tendinosis.  Bilateral lower extremity Dopplers negative for DVT.  Clinical Impression  Pt admitted with above diagnosis. Supine to sit with min assist, pt able to maintain balance sitting edge of bed x 10 minutes. She was unable to stand due to L foot pain. She lives alone in a motel room, family is only available in the evenings. She will need ST-SNF upon acute DC.  Pt currently with functional limitations due to the deficits listed below (see PT Problem List). Pt will benefit from skilled PT to increase their independence and safety with mobility to allow discharge to the venue listed below.          Recommendations for follow up therapy are one component of a multi-disciplinary discharge planning process, led by the attending physician.  Recommendations may be updated based on patient status, additional functional criteria and insurance authorization.  Follow Up Recommendations Skilled nursing-short term rehab (<3 hours/day) Can patient physically be transported by private vehicle: No    Assistance Recommended at Discharge Frequent or constant Supervision/Assistance  Patient can return home with the following  Two people  to help with walking and/or transfers;Two people to help with bathing/dressing/bathroom    Equipment Recommendations Wheelchair (measurements PT);Wheelchair cushion (measurements PT)  Recommendations for Other Services       Functional Status Assessment Patient has had a recent decline in their functional status and demonstrates the ability to make significant improvements in function in a reasonable and predictable amount of time.     Precautions / Restrictions Precautions Precautions: Fall Precaution Comments: denies falls in past 6 months Restrictions Weight Bearing Restrictions: No      Mobility  Bed Mobility Overal bed mobility: Needs Assistance Bed Mobility: Supine to Sit     Supine to sit: Min assist, HOB elevated     General bed mobility comments: used rail, HOB elevated, min A to scoot hips to EOB; pt sat edge of bed x 10 minutes    Transfers                   General transfer comment: pt attempted sit to stand but could not clear hips from bed with bed elevated, unable to stand 2* pain    Ambulation/Gait                  Stairs            Wheelchair Mobility    Modified Rankin (Stroke Patients Only)       Balance Overall balance assessment: Needs assistance Sitting-balance support: Feet supported, No upper extremity supported Sitting balance-Leahy Scale: Good  Pertinent Vitals/Pain Pain Assessment Pain Assessment: Faces Faces Pain Scale: Hurts even more Pain Location: L foot with movement Pain Descriptors / Indicators: Grimacing, Guarding Pain Intervention(s): Limited activity within patient's tolerance, Monitored during session    Home Living Family/patient expects to be discharged to:: Other (Comment) (stays in a motel) Living Arrangements: Alone Available Help at Discharge: Available PRN/intermittently;Family Type of Home: Other(Comment) (motel) Home Access: Level  entry       Home Layout: One level Home Equipment: Conservation officer, nature (2 wheels);Cane - single point Additional Comments: son and granddaughter only available in evenings    Prior Function               Mobility Comments: walked without AD at baseline, used RW prior to admission here, walking was becoming more difficult due to L foot pain, granddaughter was having to physically assist with transfer; denies falls in past 6 months ADLs Comments: was sponge bathing PTA     Hand Dominance        Extremity/Trunk Assessment   Upper Extremity Assessment Upper Extremity Assessment: Overall WFL for tasks assessed    Lower Extremity Assessment Lower Extremity Assessment: LLE deficits/detail LLE Deficits / Details: knee ext 4/5, ankle DF/PF AROM grossly WFL, painful with movement LLE Sensation: WNL    Cervical / Trunk Assessment Cervical / Trunk Assessment: Normal  Communication   Communication: No difficulties  Cognition Arousal/Alertness: Awake/alert Behavior During Therapy: WFL for tasks assessed/performed Overall Cognitive Status: Within Functional Limits for tasks assessed                                          General Comments      Exercises     Assessment/Plan    PT Assessment Patient needs continued PT services  PT Problem List Decreased activity tolerance;Pain;Decreased mobility       PT Treatment Interventions Gait training;Therapeutic exercise;Functional mobility training;Patient/family education;Therapeutic activities    PT Goals (Current goals can be found in the Care Plan section)  Acute Rehab PT Goals Patient Stated Goal: get stronger PT Goal Formulation: With patient Time For Goal Achievement: 03/06/22 Potential to Achieve Goals: Good    Frequency Min 2X/week     Co-evaluation               AM-PAC PT "6 Clicks" Mobility  Outcome Measure Help needed turning from your back to your side while in a flat bed without using  bedrails?: A Lot Help needed moving from lying on your back to sitting on the side of a flat bed without using bedrails?: A Lot Help needed moving to and from a bed to a chair (including a wheelchair)?: Total Help needed standing up from a chair using your arms (e.g., wheelchair or bedside chair)?: Total Help needed to walk in hospital room?: Total Help needed climbing 3-5 steps with a railing? : Total 6 Click Score: 8    End of Session   Activity Tolerance: Patient limited by pain Patient left: in bed;with bed alarm set;with call bell/phone within reach Nurse Communication: Mobility status;Need for lift equipment PT Visit Diagnosis: Difficulty in walking, not elsewhere classified (R26.2);Pain Pain - Right/Left: Left Pain - part of body: Ankle and joints of foot    Time: 0922-0945 PT Time Calculation (min) (ACUTE ONLY): 23 min   Charges:   PT Evaluation $PT Eval Moderate Complexity: 1 Mod PT Treatments $Therapeutic Activity: 8-22  mins      Ralene Bathe Kistler PT 02/20/2022  Acute Rehabilitation Services  Office [phone]

## 2022-02-21 DIAGNOSIS — M79672 Pain in left foot: Secondary | ICD-10-CM | POA: Diagnosis not present

## 2022-02-21 MED ORDER — FUROSEMIDE 40 MG PO TABS
40.0000 mg | ORAL_TABLET | Freq: Every day | ORAL | Status: DC
Start: 2022-02-22 — End: 2022-02-24
  Administered 2022-02-22 – 2022-02-24 (×3): 40 mg via ORAL
  Filled 2022-02-21 (×3): qty 1

## 2022-02-21 MED ORDER — ZINC OXIDE 40 % EX OINT
TOPICAL_OINTMENT | CUTANEOUS | Status: DC | PRN
  Filled 2022-02-21 (×2): qty 57

## 2022-02-21 NOTE — TOC Initial Note (Signed)
Transition of Care [facility]) - Initial/Assessment Note    Patient Details  Name: Sandra [name] MRN: [id number] Date of Birth: 1945-07-07  [facility] CM/SW Contact:    Darleene Cleaver, LCSW Phone Number: 02/21/2022, 7:11 PM  Clinical Narrative:                 Patient is a 76 year old female who is alert and oriented x4.  Patient has been living at a hotel.  CSW was informed that patient need SNF for rehab before returning back to her hotel.  CSW reviewed patient's chart and began bed search in [facility].  CSW to continue to follow patient's progress throughout discharge planning.  Expected Discharge Plan: Skilled Nursing Facility Barriers to Discharge: Continued Medical Work up   Patient Goals and CMS Choice Patient states their goals for this hospitalization and ongoing recovery are:: To go to SNF for rehab then return back home. CMS Medicare.gov Compare Post Acute Care list provided to:: Patient Choice offered to / list presented to : Patient  Expected Discharge Plan and Services Expected Discharge Plan: Skilled Nursing Facility       Living arrangements for the past 2 months: Hotel/Motel                                      Prior Living Arrangements/Services Living arrangements for the past 2 months: Hotel/Motel Lives with:: Self Patient language and need for interpreter reviewed:: Yes Do you feel safe going back to the place where you live?: No   Patient needs SNF for rehab.  Need for Family Participation in Patient Care: No (Comment) Care giver support system in place?: No (comment)   Criminal Activity/Legal Involvement Pertinent to Current Situation/Hospitalization: No - Comment as needed  Activities of Daily Living Home Assistive Devices/Equipment: None ADL Screening (condition at time of admission) Patient's cognitive ability adequate to safely complete daily activities?: Yes Is the patient deaf or have difficulty hearing?: No Does  the patient have difficulty seeing, even when wearing glasses/contacts?: No Does the patient have difficulty concentrating, remembering, or making decisions?: No Patient able to express need for assistance with ADLs?: Yes Does the patient have difficulty dressing or bathing?: Yes Independently performs ADLs?: No Feeding: Independent Bathing: Needs assistance Is this a change from baseline?: Pre-admission baseline Toileting: Needs assistance Is this a change from baseline?: Pre-admission baseline In/Out Bed: Needs assistance Is this a change from baseline?: Pre-admission baseline Walks in Home: Dependent Is this a change from baseline?: Pre-admission baseline Does the patient have difficulty walking or climbing stairs?: Yes Weakness of Legs: Both Weakness of Arms/Hands: None  Permission Sought/Granted Permission sought to share information with : Case Manager, Magazine features editor, Family Supports Permission granted to share information with : Yes, Release of Information Signed     Permission granted to share info w AGENCY: SNF admissions        Emotional Assessment Appearance:: Appears stated age   Affect (typically observed): Calm, Accepting, Stable Orientation: : Oriented to Self, Oriented to Place, Oriented to  Time, Oriented to Situation Alcohol / Substance Use: Not Applicable Psych Involvement: No (comment)  Admission diagnosis:  Left foot pain [M79.672] Foot pain, left [M79.672] Impaired mobility and ADLs [Z74.09, Z78.9] Pain of left heel [M79.672] Patient Active Problem List   Diagnosis Date Noted   Pain of left heel 02/18/2022   Essential hypertension 02/18/2022  Bilateral lower extremity edema 02/18/2022   PCP:  Patient, No Pcp Per Pharmacy:   [facility] [phone] - [place], [place] - [phone] AT [facility] [address] & [address] [place] [place] [address] Phone: [phone] Fax: [phone]     Social  Determinants of Health (SDOH) Interventions    Readmission Risk Interventions     No data to display

## 2022-02-21 NOTE — Progress Notes (Addendum)
PROGRESS NOTE    Sandra [name]  ATF:[id number] DOB: [birth year] DOA: 02/18/2022 PCP: Patient, No Pcp Per   Brief Narrative:  HPI: Sandra [name] is a 76 y.o. female with medical history significant of hypertension presented to the ED complaining of left foot/heel pain and bilateral leg swelling.  EMS reported patient having bedbugs.  Tachycardic and hypertensive on arrival to the ED.  Afebrile.  Labs significant for WBC 14.6, lactic acid pending.  X-ray of left foot moderately large posterior and inferior calcaneal enthesophytes and diffuse soft tissue swelling most pronounced dorsally.  X-ray negative for osteomyelitis.  MRI of left foot pending.  Bilateral lower extremity Dopplers negative for DVT. Patient was given ibuprofen.   Patient states about 2 months ago she noticed an abrasion on her left heel which she thinks is due to wearing plastic slippers.  States over time a callus formed in this area and then the skin was peeling off.  She has been soaking this foot in water and hydrogen peroxide and applying petroleum jelly.  She does report bilateral lower extremity edema for several months but believes lately the left foot has been more swollen.  She has not been able to walk much as she experiences significant pain every time she steps on her left foot.  Denies any any open cuts or drainage from this area.  She sits in a chair most of the day and believes that is contributing to her bilateral lower extremity edema as she does not elevate her legs.  She reports history of hypertension but has been off of medications since 2009.  Denies any other medical problems.  Denies fevers or chills.  Denies cough, shortness of breath, or chest pain.  No other complaints.  Assessment & Plan:   Principal Problem:   Pain of left heel Active Problems:   Essential hypertension   Bilateral lower extremity edema  Left heel pain: No open sores.  X-ray negative for osteomyelitis.  No cellulitis on  physical examination or on the x-ray however MRI of the foot shows severe edema of the skin with severe cellulitis.  Osteomyelitis ruled out.  We will start her on Rocephin and transition to Keflex at the time of discharge.     Hypertension, essential: Blood pressure elevated on arrival to the ED and not on any antihypertensives at home.  Started on as needed hydralazine.  Blood pressure improving.  Monitor closely.  Bilateral lower extremity edema/chronic grade 1 diastolic dysfunction: Patient tells me that she has chronic lymphedema.  No previous history of congestive heart failure, no echo in the records.  Echo this admission shows normal ejection fraction but grade 1 diastolic dysfunction.  Edema is improving.  We will transition to oral Lasix.  Bedbugs infestation: Reportedly, bedbugs were seen by EMT when she was picked up but none has been seen here.  She is maintained on contact isolation.  Morbid obesity: Weight loss and dietary modification has been counseled.  Generalized deconditioning/debility: Seen by PT OT, she lives alone in the motel, no family support, PT recommends SNF, I have consulted TOC.  Atrial fibrillation?  There has been couple of notes by the nurses when they had noted atrial fibrillation on the telemetry.  I could not find telemetry strips myself.  We have done couple of EKGs during this hospitalization which have shown only sinus tachycardia.  We will continue to monitor for now.  May benefit with event monitor.  DVT prophylaxis: SCDs Start: 02/18/22 2051  Code Status: Full Code  Family Communication:  None present at bedside.  Plan of care discussed with patient in length and he/she verbalized understanding and agreed with it.  Status is: Inpatient Remains inpatient appropriate because: Medically stable.  Waiting for placement.   Estimated body mass index is 58.58 kg/m as calculated from the following:   Height as of this encounter: 5\' 3"  (1.6 m).   Weight as  of this encounter: 150 kg.    Nutritional Assessment: Body mass index is 58.58 kg/m. Seen by dietician.  I agree with the assessment and plan as outlined below: Nutrition Status:        . Skin Assessment: I have examined the patient's skin and I agree with the wound assessment as performed by the wound care RN as outlined below:    Consultants:  None  Procedures:  None  Antimicrobials:  Anti-infectives (From admission, onward)    Start     Dose/Rate Route Frequency Ordered Stop   02/20/22 1145  cefTRIAXone (ROCEPHIN) 1 g in sodium chloride 0.9 % 100 mL IVPB        1 g 200 mL/hr over 30 Minutes Intravenous Every 24 hours 02/20/22 1059 02/27/22 1144         Subjective:  Patient seen and examined.  She has no complaints.  She is willing to go to SNF.  Objective: Vitals:   02/20/22 1746 02/20/22 2256 02/20/22 2324 02/21/22 0549  BP: (!) 134/55 (!) 132/59 118/71 129/74  Pulse: 91 95  (!) 110  Resp: 18     Temp: 98.3 F (36.8 C) 98.1 F (36.7 C)  98.6 F (37 C)  TempSrc: Oral Oral  Oral  SpO2: 95%     Weight:      Height:        Intake/Output Summary (Last 24 hours) at 02/21/2022 1119 Last data filed at 02/21/2022 0700 Gross per 24 hour  Intake 240 ml  Output 2400 ml  Net -2160 ml    Filed Weights   02/19/22 0007  Weight: (!) 150 kg    Examination:  General exam: Appears calm and comfortable, morbidly obese Respiratory system: Clear to auscultation. Respiratory effort normal. Cardiovascular system: S1 & S2 heard, RRR. No JVD, murmurs, rubs, gallops or clicks.  Improving bilateral lower extremity lymphedema Gastrointestinal system: Abdomen is nondistended, soft and nontender. No organomegaly or masses felt. Normal bowel sounds heard. Central nervous system: Alert and oriented. No focal neurological deficits. Extremities: Symmetric 5 x 5 power. Skin: No rashes, lesions or ulcers.  Psychiatry: Judgement and insight appear normal. Mood & affect  appropriate.    Data Reviewed: I have personally reviewed following labs and imaging studies  CBC: Recent Labs  Lab 02/18/22 1506 02/19/22 0052 02/20/22 0831  WBC 14.6* 11.8* 9.9  NEUTROABS 11.6*  --  6.7  HGB 13.3 11.6* 11.2*  HCT 40.9 36.7 36.0  MCV 85.9 88.0 89.3  PLT 286 241 251    Basic Metabolic Panel: Recent Labs  Lab 02/18/22 1506  NA 139  K 3.6  CL 103  CO2 28  GLUCOSE 106*  BUN 13  CREATININE 0.75  CALCIUM 8.8*    GFR: Estimated Creatinine Clearance: 90.4 mL/min (by C-G formula based on SCr of 0.75 mg/dL). Liver Function Tests: Recent Labs  Lab 02/18/22 1506  AST 35  ALT 29  ALKPHOS 63  BILITOT 0.7  PROT 7.6  ALBUMIN 3.5    No results for input(s): "LIPASE", "AMYLASE" in the last 168 hours. No  results for input(s): "AMMONIA" in the last 168 hours. Coagulation Profile: No results for input(s): "INR", "PROTIME" in the last 168 hours. Cardiac Enzymes: No results for input(s): "CKTOTAL", "CKMB", "CKMBINDEX", "TROPONINI" in the last 168 hours. BNP (last 3 results) No results for input(s): "PROBNP" in the last 8760 hours. HbA1C: No results for input(s): "HGBA1C" in the last 72 hours. CBG: No results for input(s): "GLUCAP" in the last 168 hours. Lipid Profile: No results for input(s): "CHOL", "HDL", "LDLCALC", "TRIG", "CHOLHDL", "LDLDIRECT" in the last 72 hours. Thyroid Function Tests: No results for input(s): "TSH", "T4TOTAL", "FREET4", "T3FREE", "THYROIDAB" in the last 72 hours. Anemia Panel: No results for input(s): "VITAMINB12", "FOLATE", "FERRITIN", "TIBC", "IRON", "RETICCTPCT" in the last 72 hours. Sepsis Labs: Recent Labs  Lab 02/18/22 2255 02/19/22 0052 02/20/22 1217  PROCALCITON  --   --  <0.10  LATICACIDVEN 1.4 1.5  --      No results found for this or any previous visit (from the past 240 hour(s)).   Radiology Studies: MR FOOT LEFT W WO CONTRAST  Result Date: 02/19/2022 CLINICAL DATA:  Callus on bottom of the heel. Marked  soft tissue swelling. EXAM: MRI OF THE LEFT HINDFOOT WITHOUT AND WITH CONTRAST TECHNIQUE: Multiplanar, multisequence MR imaging of the left was performed both before and after administration of intravenous contrast. CONTRAST:  86mL GADAVIST GADOBUTROL 1 MMOL/ML IV SOLN COMPARISON:  None Available. FINDINGS: Bones/Joint/Cartilage Bone marrow signal is within normal limits. No evidence of osteomyelitis. No fracture or dislocation. No joint effusion. Ligaments Ligaments of the medial and lateral aspect of the ankle are within normal limits. Muscles and Tendons Muscles are normal in bulk. Increased intramuscular signal of the plantar muscles secondary to myopathy/myositis. Mild thickening of the Achilles tendon suggesting tendinosis without discrete tear. Tendons of the flexor, extensor and peroneal compartments appear intact. Soft tissues There is marked subcutaneous soft tissue edema and skin thickening consistent with severe cellulitis prominent about the dorsum and posterior aspect of the knee. IMPRESSION: 1. Skin thickening and marked subcutaneous soft tissue edema about the ankle/foot consistent with severe cellulitis. No abnormal enhancement to suggest abscess or fluid collection. 2. 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 The remaining muscles and tendons appear maintained. No intramuscular fluid collection. 3. Marrow signal is within normal limits. No evidence of osteomyelitis or fracture/dislocation. Electronically Signed   By: Keane Police D.O.   On: 02/19/2022 22:55   ECHOCARDIOGRAM COMPLETE  Result Date: 02/19/2022    ECHOCARDIOGRAM REPORT   Patient Name:   Sandra [name] Date of Exam: 02/19/2022 Medical Rec #:  [id number]         Height:       63.0 in Accession #:    [id number]        Weight:       330.7 lb Date of Birth:  1946-05-22         BSA:          2.394 m Patient Age:    20 years          BP:           127/58 mmHg Patient Gender: F                 HR:           92 bpm. Exam Location:   Inpatient Procedure: 2D Echo, Cardiac Doppler, Color Doppler and Intracardiac            Opacification Agent Indications:    Congestive Heart Failure I50.9  History:        Patient has no prior history of Echocardiogram examinations.                 Risk Factors:Hypertension.  Sonographer:    Eartha Inch Referring Phys: [id number] Sevan Mcbroom  Sonographer Comments: Suboptimal subcostal window, suboptimal parasternal window and suboptimal apical window. Image acquisition challenging due to patient body habitus and Image acquisition challenging due to respiratory motion. IMPRESSIONS  1. Left ventricular ejection fraction, by estimation, is 60 to 65%. The left ventricle has normal function. The left ventricle has no regional wall motion abnormalities. There is mild left ventricular hypertrophy. Left ventricular diastolic parameters are consistent with Grade I diastolic dysfunction (impaired relaxation).  2. Right ventricular systolic function is normal. The right ventricular size is normal.  3. Mild-mod MV stenosis, gradient 6.0 mmHg HR 92 bpm. The mitral valve is abnormal. No evidence of mitral valve regurgitation.  4. The aortic valve was not well visualized. Aortic valve regurgitation is not visualized. Conclusion(s)/Recommendation(s): There's an increase MV gradient with echodensity on the mitral valve. Resolution is challenging with TTE. Recommend TEE. FINDINGS  Left Ventricle: Left ventricular ejection fraction, by estimation, is 60 to 65%. The left ventricle has normal function. The left ventricle has no regional wall motion abnormalities. Definity contrast agent was given IV to delineate the left ventricular  endocardial borders. The left ventricular internal cavity size was normal in size. There is mild left ventricular hypertrophy. Left ventricular diastolic parameters are consistent with Grade I diastolic dysfunction (impaired relaxation). Right Ventricle: The right ventricular size is normal. Right  ventricular systolic function is normal. Left Atrium: Left atrial size was normal in size. Right Atrium: Right atrial size was normal in size. Pericardium: The pericardium was not well visualized. Mitral Valve: Mild-mod MV stenosis, gradient 6.0 mmHg HR 92 bpm. The mitral valve is abnormal. No evidence of mitral valve regurgitation. MV peak gradient, 10.1 mmHg. The mean mitral valve gradient is 6.0 mmHg. Tricuspid Valve: Tricuspid valve regurgitation is not demonstrated. Aortic Valve: The aortic valve was not well visualized. Aortic valve regurgitation is not visualized. Aortic valve mean gradient measures 7.0 mmHg. Aortic valve peak gradient measures 12.2 mmHg. Aortic valve area, by VTI measures 3.01 cm. Pulmonic Valve: Pulmonic valve regurgitation is not visualized. Aorta: The aortic root is normal in size and structure.  LEFT VENTRICLE PLAX 2D LVIDd:         3.80 cm   Diastology LVIDs:         2.70 cm   LV e' medial:    7.72 cm/s LV PW:         1.10 cm   LV E/e' medial:  18.5 LV IVS:        1.10 cm   LV e' lateral:   5.98 cm/s LVOT diam:     2.00 cm   LV E/e' lateral: 23.9 LV SV:         93 LV SV Index:   39 LVOT Area:     3.14 cm  RIGHT VENTRICLE RV S prime:     13.30 cm/s TAPSE (M-mode): 1.8 cm LEFT ATRIUM             Index        RIGHT ATRIUM          Index LA diam:        3.80 cm 1.59 cm/m   RA Area:     9.35 cm LA Vol (A2C):   53.4 ml  22.30 ml/m  RA Volume:   16.60 ml 6.93 ml/m LA Vol (A4C):   49.9 ml 20.84 ml/m LA Biplane Vol: 52.0 ml 21.72 ml/m  AORTIC VALVE AV Area (Vmax):    2.76 cm AV Area (Vmean):   2.71 cm AV Area (VTI):     3.01 cm AV Vmax:           175.00 cm/s AV Vmean:          130.000 cm/s AV VTI:            0.309 m AV Peak Grad:      12.2 mmHg AV Mean Grad:      7.0 mmHg LVOT Vmax:         154.00 cm/s LVOT Vmean:        112.000 cm/s LVOT VTI:          0.296 m LVOT/AV VTI ratio: 0.96  AORTA Ao Root diam: 2.90 cm MITRAL VALVE MV Area (PHT): 2.07 cm     SHUNTS MV Area VTI:   2.61 cm      Systemic VTI:  0.30 m MV Peak grad:  10.1 mmHg    Systemic Diam: 2.00 cm MV Mean grad:  6.0 mmHg MV Vmax:       1.59 m/s MV Vmean:      121.0 cm/s MV Decel Time: 366 msec MV E velocity: 143.00 cm/s MV A velocity: 158.00 cm/s MV E/A ratio:  0.91 Landscape architect signed by Phineas Inches Signature Date/Time: 02/19/2022/4:02:43 PM    Final     Scheduled Meds:  enoxaparin (LOVENOX) injection  80 mg Subcutaneous Daily   furosemide  40 mg Intravenous Daily   mupirocin cream   Topical Daily   Continuous Infusions:  cefTRIAXone (ROCEPHIN)  IV 1 g (02/20/22 1328)     LOS: 2 days   Darliss Cheney, MD [facility]  02/21/2022, 11:19 AM   *Please note that this is a verbal dictation therefore any spelling or grammatical errors are due to the "[employer] [employer]" system interpretation.  Please page via [place] and do not message via secure chat for urgent patient care matters. Secure chat can be used for non urgent patient care matters.  How to contact the [facility] Attending or Consulting provider [place] or covering provider during after hours [place], for this patient?  Check the care team in [facility] and look for a) attending/consulting [facility] provider listed and b) the [facility] team listed. Page or secure chat [phone]. Log into [email or web address] and use [place]'s universal password to access. If you do not have the password, please contact the hospital operator. Locate the [facility] provider you are looking for under [facility] and page to a number that you can be directly reached. If you still have difficulty reaching the provider, please page the [facility] (Director on Call) for the Hospitalists listed on amion for assistance.

## 2022-02-21 NOTE — [place] FL2 (Signed)
[place] MEDICAID FL2 LEVEL OF CARE SCREENING TOOL     IDENTIFICATION  Patient Name: Sandra [name] Birthdate: 1945/08/10 Sex: female Admission Date (Current Location): 02/18/2022  [facility] and [place] Number:  Producer, television/film/video and Address:  [facility],  [address] [place] [address]      Provider Number: [id number]  Attending Physician Name and Address:  Hughie Closs, MD  Relative Name and Phone Number:  Lottie Mussel  [phone]    Current Level of Care: Hospital Recommended Level of Care: Skilled Nursing Facility Prior Approval Number:    Date Approved/Denied:   PASRR Number: [id number]  Discharge Plan: SNF    Current Diagnoses: Patient Active Problem List   Diagnosis Date Noted   Pain of left heel 02/18/2022   Essential hypertension 02/18/2022   Bilateral lower extremity edema 02/18/2022    Orientation RESPIRATION BLADDER Height & Weight     Self, Time, Situation, Place  Normal Continent Weight: (!) 330 lb 11 oz (150 kg) Height:  5\' 3"  (160 cm)  BEHAVIORAL SYMPTOMS/MOOD NEUROLOGICAL BOWEL NUTRITION STATUS      Continent Diet (Regular)  AMBULATORY STATUS COMMUNICATION OF NEEDS Skin   Limited Assist Verbally Skin abrasions, PU Stage and Appropriate Care                       Personal Care Assistance Level of Assistance  Bathing, Feeding, Dressing Bathing Assistance: Limited assistance Feeding assistance: Independent Dressing Assistance: Limited assistance     Functional Limitations Info  Sight, Speech, Hearing Sight Info: Adequate Hearing Info: Adequate Speech Info: Adequate    SPECIAL CARE FACTORS FREQUENCY  OT (By licensed OT), PT (By licensed PT)     PT Frequency: Minimum 5x a week OT Frequency: Minimum 5x a week            Contractures Contractures Info: Not present    Additional Factors Info  Code Status, Allergies Code Status Info: Full Code Allergies Info: No Known Allergies            Current Medications (02/21/2022):  This is the current hospital active medication list Current Facility-Administered Medications  Medication Dose Route Frequency Provider Last Rate Last Admin   acetaminophen (TYLENOL) tablet 1,000 mg  1,000 mg Oral Q8H PRN 02/23/2022, MD       cefTRIAXone (ROCEPHIN) 1 g in sodium chloride 0.9 % 100 mL IVPB  1 g Intravenous Q24H Pahwani, John Giovanni, MD 200 mL/hr at 02/21/22 1228 1 g at 02/21/22 1228   enoxaparin (LOVENOX) injection 80 mg  80 mg Subcutaneous Daily 02/23/22, MD   80 mg at 02/21/22 0848   [START ON 02/22/2022] furosemide (LASIX) tablet 40 mg  40 mg Oral Daily Pahwani, 02/24/2022, MD       hydrALAZINE (APRESOLINE) injection 10 mg  10 mg Intravenous Q6H PRN Daleen Bo, MD       liver oil-zinc oxide (DESITIN) 40 % ointment   Topical PRN Pahwani, Ravi, MD       metoprolol tartrate (LOPRESSOR) injection 2.5 mg  2.5 mg Intravenous Q2H PRN Hughie Closs, NP   2.5 mg at 02/21/22 0550   mupirocin cream (BACTROBAN) 2 %   Topical Daily 02/23/22, MD   Given at 02/21/22 1227     Discharge Medications: Please see discharge summary for a list of discharge medications.  Relevant Imaging Results:  Relevant Lab Results:   Additional Information SSN [id number]  [id number], LCSW

## 2022-02-21 NOTE — Progress Notes (Signed)
Patient previously normal sinus rhythm went into afib with HR up to 150s, on call physician  was notified and order for 2.5mg  IV metoprolol was placed. RN administered metoprolol per order and HR returned to NSR after about an hour. Patient asymptomatic. Around 0545am patient again went into afib with HR up to 150s. 2.5 IV metoprolol administered per order. Patient asymptomatic. RN at bedside. Patient is on telemtry monitor. RN will continue to monitor tele monitor for changes in HR. Will notify physician  if patient doesn't responded to metoprolol.Patient states that all acute needs have been addressed at this time.

## 2022-02-22 DIAGNOSIS — M79672 Pain in left foot: Secondary | ICD-10-CM | POA: Diagnosis not present

## 2022-02-22 MED ORDER — METOPROLOL TARTRATE 5 MG/5ML IV SOLN
5.0000 mg | Freq: Once | INTRAVENOUS | Status: AC
  Administered 2022-02-22: 5 mg via INTRAVENOUS
  Filled 2022-02-22: qty 5

## 2022-02-22 MED ORDER — METOPROLOL TARTRATE 5 MG/5ML IV SOLN: 2.5000 mg | Freq: Four times a day (QID) | INTRAVENOUS | Status: DC | PRN

## 2022-02-22 MED ORDER — BISACODYL 5 MG PO TBEC
5.0000 mg | DELAYED_RELEASE_TABLET | Freq: Every day | ORAL | Status: DC | PRN
Start: 2022-02-22 — End: 2022-02-24
  Administered 2022-02-22: 5 mg via ORAL
  Filled 2022-02-22: qty 1

## 2022-02-22 NOTE — Progress Notes (Signed)
PROGRESS NOTE    Sandra [name]  HBZ:[id number] DOB: [birth year] DOA: 02/18/2022 PCP: Patient, No Pcp Per   Brief Narrative:  Sandra [name] is a 76 y.o. female with medical history significant of hypertension presented to the ED complaining of left foot/heel pain and bilateral leg swelling.  EMS reported patient having bedbugs.  Tachycardic and hypertensive on arrival to the ED.  Afebrile.  Labs significant for WBC 14.6, lactic acid pending.  X-ray of left foot moderately large posterior and inferior calcaneal enthesophytes and diffuse soft tissue swelling most pronounced dorsally.  X-ray negative for osteomyelitis.  MRI of left foot pending.  Bilateral lower extremity Dopplers negative for DVT. Patient was given ibuprofen.   Patient states about 2 months ago she noticed an abrasion on her left heel which she thinks is due to wearing plastic slippers.  States over time a callus formed in this area and then the skin was peeling off.  She has been soaking this foot in water and hydrogen peroxide and applying petroleum jelly.  She does report bilateral lower extremity edema for several months but believes lately the left foot has been more swollen.  She has not been able to walk much as she experiences significant pain every time she steps on her left foot.  Denies any any open cuts or drainage from this area.  She sits in a chair most of the day and believes that is contributing to her bilateral lower extremity edema as she does not elevate her legs.  She reports history of hypertension but has been off of medications since 2009.  Denies any other medical problems.  Denies fevers or chills.  Denies cough, shortness of breath, or chest pain.  No other complaints.  Assessment & Plan:   Principal Problem:   Pain of left heel Active Problems:   Essential hypertension   Bilateral lower extremity edema  Left heel pain/cellulitis: No open sores.  X-ray negative for osteomyelitis.  No cellulitis on  physical examination or on the x-ray however MRI of the foot shows severe edema of the skin with severe cellulitis.  Osteomyelitis ruled out.  We will start her on Rocephin and transition to Keflex at the time of discharge.     Hypertension, essential: Blood pressure elevated on arrival to the ED and not on any antihypertensives at home.  Started on as needed hydralazine.  Blood pressure improving.  Monitor closely.  Bilateral lower extremity edema/chronic grade 1 diastolic dysfunction: Patient tells me that she has chronic lymphedema.  No previous history of congestive heart failure, no echo in the records.  Echo this admission shows normal ejection fraction but grade 1 diastolic dysfunction.  Edema is improving.  Continue on Lasix.  Bedbugs infestation: Reportedly, bedbugs were seen by EMT when she was picked up but none has been seen here.  She is maintained on contact isolation.  Morbid obesity: Weight loss and dietary modification has been counseled.  Generalized deconditioning/debility: Seen by PT OT, she lives alone in the motel, no family support, PT recommends SNF, will see on board.  Atrial fibrillation?  There has been couple of notes by the nurses when they had noted atrial fibrillation on the telemetry, on date 02/21/2022.  I could not find telemetry strips myself.  We have done couple of EKGs during this hospitalization which have shown only sinus tachycardia.  No further reports of atrial fibrillation have been reported.  We will continue to monitor for now.  May benefit with event monitor.  DVT prophylaxis: SCDs Start: 02/18/22 2051   Code Status: Full Code  Family Communication:  None present at bedside.  Plan of care discussed with patient in length and he/she verbalized understanding and agreed with it.  Status is: Inpatient Remains inpatient appropriate because: Medically stable.  Waiting for placement.   Estimated body mass index is 58.58 kg/m as calculated from the  following:   Height as of this encounter: 5\' 3"  (1.6 m).   Weight as of this encounter: 150 kg.    Nutritional Assessment: Body mass index is 58.58 kg/m.Marland Kitchen Seen by dietician.  I agree with the assessment and plan as outlined below: Nutrition Status:        . Skin Assessment: I have examined the patient's skin and I agree with the wound assessment as performed by the wound care RN as outlined below:    Consultants:  None  Procedures:  None  Antimicrobials:  Anti-infectives (From admission, onward)    Start     Dose/Rate Route Frequency Ordered Stop   02/20/22 1145  cefTRIAXone (ROCEPHIN) 1 g in sodium chloride 0.9 % 100 mL IVPB        1 g 200 mL/hr over 30 Minutes Intravenous Every 24 hours 02/20/22 1059 02/27/22 1144         Subjective:  Seen and examined.  No complaints.  Objective: Vitals:   02/20/22 2324 02/21/22 0549 02/21/22 1201 02/21/22 1946  BP: 118/71 129/74 112/66 (!) 145/65  Pulse:  (!) 110 79 96  Resp:   18 17  Temp:  98.6 F (37 C) 98.2 F (36.8 C) 98.2 F (36.8 C)  TempSrc:  Oral Oral Oral  SpO2:   95% 95%  Weight:      Height:        Intake/Output Summary (Last 24 hours) at 02/22/2022 1316 Last data filed at 02/22/2022 0355 Gross per 24 hour  Intake --  Output 1400 ml  Net -1400 ml    Filed Weights   02/19/22 0007  Weight: (!) 150 kg    Examination:  General exam: Appears calm and comfortable  Respiratory system: Clear to auscultation. Respiratory effort normal. Cardiovascular system: S1 & S2 heard, RRR. No JVD, murmurs, rubs, gallops or clicks.  Bilateral lower extremity lymphedema. Gastrointestinal system: Abdomen is nondistended, soft and nontender. No organomegaly or masses felt. Normal bowel sounds heard. Central nervous system: Alert and oriented. No focal neurological deficits. Extremities: Symmetric 5 x 5 power. Skin: No rashes, lesions or ulcers.  Psychiatry: Judgement and insight appear normal. Mood & affect  appropriate.    Data Reviewed: I have personally reviewed following labs and imaging studies  CBC: Recent Labs  Lab 02/18/22 1506 02/19/22 0052 02/20/22 0831  WBC 14.6* 11.8* 9.9  NEUTROABS 11.6*  --  6.7  HGB 13.3 11.6* 11.2*  HCT 40.9 36.7 36.0  MCV 85.9 88.0 89.3  PLT 286 241 [id number]    Basic Metabolic Panel: Recent Labs  Lab 02/18/22 1506  NA 139  K 3.6  CL 103  CO2 28  GLUCOSE 106*  BUN 13  CREATININE 0.75  CALCIUM 8.8*    GFR: Estimated Creatinine Clearance: 90.4 mL/min (by C-G formula based on SCr of 0.75 mg/dL). Liver Function Tests: Recent Labs  Lab 02/18/22 1506  AST 35  ALT 29  ALKPHOS 63  BILITOT 0.7  PROT 7.6  ALBUMIN 3.5    No results for input(s): "LIPASE", "AMYLASE" in the last 168 hours. No results for input(s): "AMMONIA" in the last  168 hours. Coagulation Profile: No results for input(s): "INR", "PROTIME" in the last 168 hours. Cardiac Enzymes: No results for input(s): "CKTOTAL", "CKMB", "CKMBINDEX", "TROPONINI" in the last 168 hours. BNP (last 3 results) No results for input(s): "PROBNP" in the last 8760 hours. HbA1C: No results for input(s): "HGBA1C" in the last 72 hours. CBG: No results for input(s): "GLUCAP" in the last 168 hours. Lipid Profile: No results for input(s): "CHOL", "HDL", "LDLCALC", "TRIG", "CHOLHDL", "LDLDIRECT" in the last 72 hours. Thyroid Function Tests: No results for input(s): "TSH", "T4TOTAL", "FREET4", "T3FREE", "THYROIDAB" in the last 72 hours. Anemia Panel: No results for input(s): "VITAMINB12", "FOLATE", "FERRITIN", "TIBC", "IRON", "RETICCTPCT" in the last 72 hours. Sepsis Labs: Recent Labs  Lab 02/18/22 2255 02/19/22 0052 02/20/22 1217  PROCALCITON  --   --  <0.10  LATICACIDVEN 1.4 1.5  --      No results found for this or any previous visit (from the past 240 hour(s)).   Radiology Studies: No results found.  Scheduled Meds:  enoxaparin (LOVENOX) injection  80 mg Subcutaneous Daily    furosemide  40 mg Oral Daily   mupirocin cream   Topical Daily   Continuous Infusions:  cefTRIAXone (ROCEPHIN)  IV 1 g (02/22/22 1048)     LOS: 3 days   Hughie Closs, MD [facility]  02/22/2022, 1:16 PM   *Please note that this is a verbal dictation therefore any spelling or grammatical errors are due to the "Dragon Medical One" system interpretation.  Please page via Amion and do not message via secure chat for urgent patient care matters. Secure chat can be used for non urgent patient care matters.  How to contact the [facility] Attending or Consulting provider 7A - 7P or covering provider during after hours 7P -7A, for this patient?  Check the care team in [facility] and look for a) attending/consulting TRH provider listed and b) the [facility] team listed. Page or secure chat 7A-7P. Log into [email or web address] and use [place]'s universal password to access. If you do not have the password, please contact the hospital operator. Locate the [facility] provider you are looking for under [facility] and page to a number that you can be directly reached. If you still have difficulty reaching the provider, please page the [facility] (Director on Call) for the Hospitalists listed on amion for assistance.

## 2022-02-22 NOTE — Care Management Important Message (Signed)
Important Message  Patient Details IM Letter given to the Patient. Name: KELY DOHN MRN: [id number] Date of Birth: Aug 15, 1945   Medicare Important Message Given:  Yes     Caren Macadam 02/22/2022, 12:33 PM

## 2022-02-22 NOTE — Progress Notes (Signed)
Physical Therapy Treatment Patient Details Name: Sandra [name] MRN: [id number] DOB: 08-14-1945 Today's Date: 02/22/2022   History of Present Illness Patient is a 76 year old female with medical history significant of hypertension presented to the ED complaining of left foot/heel pain and bilateral leg swelling.  EMS reported patient having bedbugs.  Tachycardic and hypertensive on arrival to the ED. X-ray of left foot moderately large posterior and inferior calcaneal enthesophytes and diffuse soft tissue swelling most pronounced dorsally.  X-ray negative for osteomyelitis.  MRI of left foot showed Skin thickening and marked subcutaneous soft tissue edema about  the ankle/foot consistent with severe cellulitis, and achilles tendinosis.  Bilateral lower extremity Dopplers negative for DVT.    PT Comments    Pt was able to perform supine to sit independently using the bedrail with head of bed elevated. She sat at the edge of the bed for ~15 minutes. Pt performed sit to stand with Stedy lift equipment, and was able to stand for ~90 seconds. Transfer to recliner with Antony Salmon was not attempted as flaps of Stedy could not be placed behind pt due to body habitus. Instructed pt in BLE exercises to be done independently in bed.     Recommendations for follow up therapy are one component of a multi-disciplinary discharge planning process, led by the attending physician.  Recommendations may be updated based on patient status, additional functional criteria and insurance authorization.  Follow Up Recommendations  Skilled nursing-short term rehab (<3 hours/day) Can patient physically be transported by private vehicle: No   Assistance Recommended at Discharge Frequent or constant Supervision/Assistance  Patient can return home with the following Two people to help with walking and/or transfers;Two people to help with bathing/dressing/bathroom;Assist for transportation;Help with stairs or ramp for  entrance;Assistance with Charity fundraiser (measurements PT);Wheelchair cushion (measurements PT)    Recommendations for Other Services       Precautions / Restrictions Precautions Precautions: Fall Precaution Comments: denies falls in past 6 months Restrictions Weight Bearing Restrictions: No     Mobility  Bed Mobility Overal bed mobility: Needs Assistance Bed Mobility: Supine to Sit     Supine to sit: HOB elevated, Modified independent (Device/Increase time) Sit to supine: Min assist   General bed mobility comments: used bedrail for supine to sit, increased time/effort; physical assist to get BLE back into bed.    Transfers Overall transfer level: Needs assistance Equipment used:  Antony Salmon) Transfers: Sit to/from Stand Sit to Stand: From elevated surface, Min assist           General transfer comment: sit to stand with Stedy lift equipment and bed elevated; pt able to stand for ~90 seconds, initially with trunk flexed then with upright posture    Ambulation/Gait                   Stairs             Wheelchair Mobility    Modified Rankin (Stroke Patients Only)       Balance Overall balance assessment: Needs assistance Sitting-balance support: Feet supported, No upper extremity supported Sitting balance-Leahy Scale: Good                                      Cognition Arousal/Alertness: Awake/alert Behavior During Therapy: WFL for tasks assessed/performed Overall Cognitive Status: Within Functional Limits for tasks assessed  Exercises General Exercises - Lower Extremity Ankle Circles/Pumps: AROM, Both, 10 reps, Supine Quad Sets: AROM, Both, 5 reps, Supine Heel Slides: AAROM, Both, 10 reps, Supine    General Comments        Pertinent Vitals/Pain Pain Assessment Faces Pain Scale: Hurts even more Pain Location: L foot  with movement Pain Descriptors / Indicators: Grimacing, Guarding Pain Intervention(s): Limited activity within patient's tolerance, Monitored during session, Repositioned    Home Living                          Prior Function            PT Goals (current goals can now be found in the care plan section) Acute Rehab PT Goals Patient Stated Goal: get stronger PT Goal Formulation: With patient Time For Goal Achievement: 03/06/22 Potential to Achieve Goals: Good Progress towards PT goals: Progressing toward goals    Frequency    Min 2X/week      PT Plan Current plan remains appropriate    Co-evaluation              AM-PAC PT "6 Clicks" Mobility   Outcome Measure  Help needed turning from your back to your side while in a flat bed without using bedrails?: A Lot Help needed moving from lying on your back to sitting on the side of a flat bed without using bedrails?: A Lot Help needed moving to and from a bed to a chair (including a wheelchair)?: Total Help needed standing up from a chair using your arms (e.g., wheelchair or bedside chair)?: Total Help needed to walk in hospital room?: Total Help needed climbing 3-5 steps with a railing? : Total 6 Click Score: 8    End of Session   Activity Tolerance: Patient limited by pain Patient left: in bed;with bed alarm set;with call bell/phone within reach Nurse Communication: Mobility status;Need for lift equipment PT Visit Diagnosis: Difficulty in walking, not elsewhere classified (R26.2);Pain Pain - Right/Left: Left Pain - part of body: Ankle and joints of foot     Time: 4128-7867 PT Time Calculation (min) (ACUTE ONLY): 34 min  Charges:  $Therapeutic Exercise: 8-22 mins $Therapeutic Activity: 8-22 mins                    Ralene Bathe Kistler PT 02/22/2022  Acute Rehabilitation Services  Office [phone]

## 2022-02-23 DIAGNOSIS — M79672 Pain in left foot: Secondary | ICD-10-CM | POA: Diagnosis not present

## 2022-02-23 LAB — TROPONIN I (HIGH SENSITIVITY)
Troponin I (High Sensitivity): 12 ng/L (ref <18)
Troponin I (High Sensitivity): 10 ng/L (ref <18)

## 2022-02-23 LAB — TSH: TSH: 1.852 u[IU]/mL (ref 0.350–4.500)

## 2022-02-23 LAB — T4, FREE: Free T4: 1.04 ng/dL (ref 0.61–1.12)

## 2022-02-23 NOTE — TOC Progression Note (Signed)
Transition of Care [facility]) - Progression Note    Patient Details  Name: Sandra [name] MRN: [id number] Date of Birth: 05-14-1946  Transition of Care [facility]) CM/SW Contact  Lavenia Atlas, RN Phone Number: 02/23/2022, 1:04 PM  Clinical Narrative:  SNF bed offers presented to patient ([email or web address]). Patient discussed with her son.Patient chose [facility]. Per Wille Celeste with [facility] bed will be available tomorrow am, also notified Janie that PT recc wheelchair & wc cushion. MD, RN notified.   TOC will continue to follow.    Expected Discharge Plan: Skilled Nursing Facility Barriers to Discharge: Continued Medical Work up  Expected Discharge Plan and Services Expected Discharge Plan: Skilled Nursing Facility   Discharge Planning Services: CM Consult Post Acute Care Choice: Skilled Nursing Facility Living arrangements for the past 2 months: Hotel/Motel                       Social Determinants of Health (SDOH) Interventions    Readmission Risk Interventions     No data to display

## 2022-02-23 NOTE — Progress Notes (Incomplete)
         CROSS COVER NOTE  NAME: Sandra [name] MRN: [id number] DOB : 1945/07/07    Date of Service   02/23/2022   HPI/Events of Note   Contacted by nursing with concern that patient's telemetry was discontinued today. Sandra [name] was in AFIB w RVR last night, has a documented pulse rate in 140s at 1258 and 1500 vital sign check today.  0130: Notified by nursing that patient is in Atrial flutter on telemetry. HR 90s-110s. RN reports she saw HR elevate to 150 but it did not sustain. Sandra [name] is asymptomatic and BP 127/74. On review of EKG tab in EPIC Sandra [name] was noted to be in AFIB/Aflutter with RVR on 8/18 and 8/21. Reviewed TRH rounder's note and prior [employer] notes. Troponin, TSH, and FT4 ordered this morning were within normal range.  Interventions   Plan:  Paraoxysmal Atrial Fibrillation/Atrial Flutter  Resume telemetry Continue PRN IV metoprolol Consider PO anticoagulation for stroke risk stratification Consider initiating daily PO rate/rhythm control  K and Mg with AM labs        This document was prepared using Dragon voice recognition software and may include unintentional dictation errors.  Sandra Limbo DNP, MHA, FNP-BC Nurse Practitioner [facility] Pager [phone]

## 2022-02-23 NOTE — Progress Notes (Signed)
PROGRESS NOTE    Sandra [name]  [id number] DOB: 1946/01/09 DOA: 02/18/2022 PCP: Patient, No Pcp Per   Brief Narrative:  Sandra [name] is a 76 y.o. female with medical history significant of hypertension presented to the ED complaining of left foot/heel pain and bilateral leg swelling.  EMS reported patient having bedbugs.  Tachycardic and hypertensive on arrival to the ED.  Afebrile.  Labs significant for WBC 14.6, lactic acid pending.  X-ray of left foot moderately large posterior and inferior calcaneal enthesophytes and diffuse soft tissue swelling most pronounced dorsally.  X-ray negative for osteomyelitis.  MRI of left foot pending.  Bilateral lower extremity Dopplers negative for DVT. Patient was given ibuprofen.   Patient states about 2 months ago she noticed an abrasion on her left heel which she thinks is due to wearing plastic slippers.  States over time a callus formed in this area and then the skin was peeling off.  She has been soaking this foot in water and hydrogen peroxide and applying petroleum jelly.  She does report bilateral lower extremity edema for several months but believes lately the left foot has been more swollen.  She has not been able to walk much as she experiences significant pain every time she steps on her left foot.  Denies any any open cuts or drainage from this area.  She sits in a chair most of the day and believes that is contributing to her bilateral lower extremity edema as she does not elevate her legs.  She reports history of hypertension but has been off of medications since 2009.  Denies any other medical problems.  Denies fevers or chills.  Denies cough, shortness of breath, or chest pain.  No other complaints.  Assessment & Plan:   Principal Problem:   Pain of left heel Active Problems:   Essential hypertension   Bilateral lower extremity edema  Left heel pain/cellulitis: No open sores.  X-ray negative for osteomyelitis.  No cellulitis on  physical examination or on the x-ray however MRI of the foot shows severe edema of the skin with severe cellulitis.  Osteomyelitis ruled out.  We we will continue Rocephin and transition to Keflex at the time of discharge to complete total of 10 days.     Hypertension, essential: Blood pressure elevated on arrival to the ED and not on any antihypertensives at home.  Started on as needed hydralazine.  Blood pressure improving.  Monitor closely.  Bilateral lower extremity edema/chronic grade 1 diastolic dysfunction: Patient tells me that she has chronic lymphedema.  No previous history of congestive heart failure, no echo in the records.  Echo this admission shows normal ejection fraction but grade 1 diastolic dysfunction.  Edema is improving.  Continue on Lasix.  Bedbugs infestation: Reportedly, bedbugs were seen by EMT when she was picked up but none has been seen here.  Contact isolation was removed by the nurses on 02/22/2022.  Morbid obesity: Weight loss and dietary modification has been counseled.  Generalized deconditioning/debility: Seen by PT OT, she lives alone in the motel, no family support, PT recommends SNF, TOC on board and per them, she has been accepted to [facility] but bed will be available tomorrow.  Atrial fibrillation?  There has been couple of notes by the nurses when they had noted atrial fibrillation on the telemetry, on date 02/21/2022.  I could not find telemetry strips myself.  We have done couple of EKGs during this hospitalization which have shown only sinus tachycardia.  No  further reports of atrial fibrillation have been reported.  We will continue to monitor for now.   DVT prophylaxis: SCDs Start: 02/18/22 2051   Code Status: Full Code  Family Communication:  None present at bedside.  Plan of care discussed with patient in length and he/she verbalized understanding and agreed with it.  Status is: Inpatient Remains inpatient appropriate because: Medically stable.   Waiting for placement.  Per TOC, [facility] will have bed for her tomorrow.   Estimated body mass index is 58.58 kg/m as calculated from the following:   Height as of this encounter: 5\' 3"  (1.6 m).   Weight as of this encounter: 150 kg.    Nutritional Assessment: Body mass index is 58.58 kg/m. Seen by dietician.  I agree with the assessment and plan as outlined below: Nutrition Status:        . Skin Assessment: I have examined the patient's skin and I agree with the wound assessment as performed by the wound care RN as outlined below:    Consultants:  None  Procedures:  None  Antimicrobials:  Anti-infectives (From admission, onward)    Start     Dose/Rate Route Frequency Ordered Stop   02/20/22 1145  cefTRIAXone (ROCEPHIN) 1 g in sodium chloride 0.9 % 100 mL IVPB        1 g 200 mL/hr over 30 Minutes Intravenous Every 24 hours 02/20/22 1059 02/27/22 1144         Subjective:  Patient seen and examined.  No complaints.  Objective: Vitals:   02/21/22 1201 02/21/22 1946 02/22/22 2037 02/23/22 1258  BP: 112/66 (!) 145/65 (!) 124/101 122/75  Pulse: 79 96 (!) 109 (!) 145  Resp: 18 17 19 18   Temp: 98.2 F (36.8 C) 98.2 F (36.8 C) 98.2 F (36.8 C) 97.9 F (36.6 C)  TempSrc: Oral Oral Oral Oral  SpO2: 95% 95% 97% 98%  Weight:      Height:        Intake/Output Summary (Last 24 hours) at 02/23/2022 1311 Last data filed at 02/23/2022 0210 Gross per 24 hour  Intake --  Output 900 ml  Net -900 ml    Filed Weights   02/19/22 0007  Weight: (!) 150 kg    Examination:  General exam: Appears calm and comfortable, obese Respiratory system: Clear to auscultation. Respiratory effort normal. Cardiovascular system: S1 & S2 heard, RRR. No JVD, murmurs, rubs, gallops or clicks.  Moderate lymphedema bilateral lower extremity Gastrointestinal system: Abdomen is nondistended, soft and nontender. No organomegaly or masses felt. Normal bowel sounds heard. Central  nervous system: Alert and oriented. No focal neurological deficits. Extremities: Symmetric 5 x 5 power. Skin: No rashes, lesions or ulcers.  Psychiatry: Judgement and insight appear normal. Mood & affect appropriate.    Data Reviewed: I have personally reviewed following labs and imaging studies  CBC: Recent Labs  Lab 02/18/22 1506 02/19/22 0052 02/20/22 0831  WBC 14.6* 11.8* 9.9  NEUTROABS 11.6*  --  6.7  HGB 13.3 11.6* 11.2*  HCT 40.9 36.7 36.0  MCV 85.9 88.0 89.3  PLT 286 241 251    Basic Metabolic Panel: Recent Labs  Lab 02/18/22 1506  NA 139  K 3.6  CL 103  CO2 28  GLUCOSE 106*  BUN 13  CREATININE 0.75  CALCIUM 8.8*    GFR: Estimated Creatinine Clearance: 90.4 mL/min (by C-G formula based on SCr of 0.75 mg/dL). Liver Function Tests: Recent Labs  Lab 02/18/22 1506  AST 35  ALT  29  ALKPHOS 63  BILITOT 0.7  PROT 7.6  ALBUMIN 3.5    No results for input(s): "LIPASE", "AMYLASE" in the last 168 hours. No results for input(s): "AMMONIA" in the last 168 hours. Coagulation Profile: No results for input(s): "INR", "PROTIME" in the last 168 hours. Cardiac Enzymes: No results for input(s): "CKTOTAL", "CKMB", "CKMBINDEX", "TROPONINI" in the last 168 hours. BNP (last 3 results) No results for input(s): "PROBNP" in the last 8760 hours. HbA1C: No results for input(s): "HGBA1C" in the last 72 hours. CBG: No results for input(s): "GLUCAP" in the last 168 hours. Lipid Profile: No results for input(s): "CHOL", "HDL", "LDLCALC", "TRIG", "CHOLHDL", "LDLDIRECT" in the last 72 hours. Thyroid Function Tests: Recent Labs    02/23/22 0620  TSH 1.852  FREET4 1.04   Anemia Panel: No results for input(s): "VITAMINB12", "FOLATE", "FERRITIN", "TIBC", "IRON", "RETICCTPCT" in the last 72 hours. Sepsis Labs: Recent Labs  Lab 02/18/22 2255 02/19/22 0052 02/20/22 1217  PROCALCITON  --   --  <0.10  LATICACIDVEN 1.4 1.5  --      No results found for this or any  previous visit (from the past 240 hour(s)).   Radiology Studies: No results found.  Scheduled Meds:  enoxaparin (LOVENOX) injection  80 mg Subcutaneous Daily   furosemide  40 mg Oral Daily   mupirocin cream   Topical Daily   Continuous Infusions:  cefTRIAXone (ROCEPHIN)  IV 1 g (02/23/22 1013)     LOS: 4 days   Hughie Closs, MD [facility]  02/23/2022, 1:11 PM   *Please note that this is a verbal dictation therefore any spelling or grammatical errors are due to the "Dragon [employer] One" system interpretation.  Please page via Amion and do not message via secure chat for urgent patient care matters. Secure chat can be used for non urgent patient care matters.  How to contact the [facility] Attending or Consulting provider 7A - 7P or covering provider during after hours 7P -7A, for this patient?  Check the care team in [facility] and look for a) attending/consulting [facility] provider listed and b) the [facility] team listed. Page or secure chat 7A-7P. Log into [email or web address] and use [place]'s universal password to access. If you do not have the password, please contact the hospital operator. Locate the [facility] provider you are looking for under [facility] and page to a number that you can be directly reached. If you still have difficulty reaching the provider, please page the [facility] (Director on Call) for the Hospitalists listed on amion for assistance.

## 2022-02-24 ENCOUNTER — Other Ambulatory Visit (HOSPITAL_COMMUNITY): Payer: Self-pay

## 2022-02-24 DIAGNOSIS — I4891 Unspecified atrial fibrillation: Secondary | ICD-10-CM

## 2022-02-24 DIAGNOSIS — I10 Essential (primary) hypertension: Secondary | ICD-10-CM | POA: Diagnosis not present

## 2022-02-24 DIAGNOSIS — L03116 Cellulitis of left lower limb: Secondary | ICD-10-CM

## 2022-02-24 DIAGNOSIS — I5032 Chronic diastolic (congestive) heart failure: Secondary | ICD-10-CM

## 2022-02-24 DIAGNOSIS — I342 Nonrheumatic mitral (valve) stenosis: Secondary | ICD-10-CM | POA: Diagnosis not present

## 2022-02-24 DIAGNOSIS — I480 Paroxysmal atrial fibrillation: Secondary | ICD-10-CM

## 2022-02-24 LAB — BASIC METABOLIC PANEL
Anion gap: 7 (ref 5–15)
BUN: 12 mg/dL (ref 8–23)
CO2: 28 mmol/L (ref 22–32)
Calcium: 8.4 mg/dL — ABNORMAL LOW (ref 8.9–10.3)
Chloride: 104 mmol/L (ref 98–111)
Creatinine, Ser: 0.77 mg/dL (ref 0.44–1.00)
GFR, Estimated: >60 mL/min (ref >60)
Glucose, Bld: 98 mg/dL (ref 70–99)
Potassium: 3.9 mmol/L (ref 3.5–5.1)
Sodium: 139 mmol/L (ref 135–145)

## 2022-02-24 LAB — CBC
HCT: 39.9 % (ref 36.0–46.0)
Hemoglobin: 13 g/dL (ref 12.0–15.0)
MCH: 28.1 pg (ref 26.0–34.0)
MCHC: 32.6 g/dL (ref 30.0–36.0)
MCV: 86.4 fL (ref 80.0–100.0)
Platelets: 271 10*3/uL (ref 150–400)
RBC: 4.62 MIL/uL (ref 3.87–5.11)
RDW: 15.5 % (ref 11.5–15.5)
WBC: 9.6 10*3/uL (ref 4.0–10.5)
nRBC: 0 % (ref 0.0–0.2)

## 2022-02-24 LAB — MAGNESIUM: Magnesium: 2.1 mg/dL (ref 1.7–2.4)

## 2022-02-24 MED ORDER — APIXABAN 5 MG PO TABS: 5.0000 mg | ORAL_TABLET | Freq: Two times a day (BID) | ORAL | Status: DC

## 2022-02-24 MED ORDER — METOPROLOL TARTRATE 50 MG PO TABS
50.0000 mg | ORAL_TABLET | Freq: Two times a day (BID) | ORAL | Status: DC
  Administered 2022-02-24: 50 mg via ORAL
  Filled 2022-02-24: qty 1

## 2022-02-24 MED ORDER — METOPROLOL TARTRATE 50 MG PO TABS: 50.0000 mg | ORAL_TABLET | Freq: Two times a day (BID) | ORAL | Status: DC

## 2022-02-24 MED ORDER — POTASSIUM CHLORIDE CRYS ER 10 MEQ PO TBCR
10.0000 meq | EXTENDED_RELEASE_TABLET | Freq: Every day | ORAL | Status: DC
Start: 2022-02-24 — End: 2022-05-14

## 2022-02-24 MED ORDER — APIXABAN 5 MG PO TABS
5.0000 mg | ORAL_TABLET | Freq: Two times a day (BID) | ORAL | Status: DC
Start: 2022-02-24 — End: 2022-02-24
  Administered 2022-02-24: 5 mg via ORAL
  Filled 2022-02-24: qty 1

## 2022-02-24 MED ORDER — FUROSEMIDE 20 MG PO TABS: 20.0000 mg | ORAL_TABLET | Freq: Every day | ORAL | Status: DC

## 2022-02-24 MED ORDER — CEPHALEXIN 500 MG PO CAPS: 500.0000 mg | ORAL_CAPSULE | Freq: Three times a day (TID) | ORAL | Status: AC

## 2022-02-24 NOTE — Assessment & Plan Note (Signed)
-   New metop - Anticoagulation - OSA eval

## 2022-02-24 NOTE — Progress Notes (Signed)
Pt discharged to [facility]. Prior to DC, IV and tele was DC. AVS was printed and placed in discharge packet. Pt stable at time of DC and left with transportation via [facility].

## 2022-02-24 NOTE — Discharge Instructions (Signed)

## 2022-02-24 NOTE — Progress Notes (Signed)
Pt was placed back on tele during night shift due to HR increasing again. Around 21:35 pt went into a - flutter with rate of 150, pt was asymptomatic and vital signs were stable. She has converted between a-flutter, a fib RVR and SR throughout the night. Pt has not shown any signs or symptoms or any abnormal vitals throughout the night. She is to leave to a Rehab today because she has a room, but pt definitely needs a cardiology work-up/ consult. I have notified the on-call physician about changes throoughout the night and no new orders have been placed. RN will report to dayshift nurse and follow up with day team.

## 2022-02-24 NOTE — Consult Note (Addendum)
Cardiology Consultation:   Patient ID: FILIPPA YARBOUGH MRN: [id number]; DOB: [birth year]  Admit date: 02/18/2022 Date of Consult: 02/24/2022  PCP:  Patient, No Pcp Per   [facility] Providers Cardiologist: New  Patient Profile:   Sandra [name] is a 76 y.o. female with a hx of hypertension who is being seen 02/24/2022 for the evaluation of atrial fibrillation with rapid ventricular rate at the request of Dr. Loleta Books.  History of Present Illness:   Ms. Baisch presented 8/17 with 2 months history of left foot/heel pain with bilateral leg swelling.  She was admitted for cellulitis based on MRI.  No osteomyelitis on x-ray.  Treated with Rocephin and transition to Keflex.  Patient with history of chronic lymphedema.  Echocardiogram this admission showed preserved LV function with grade 1 diastolic dysfunction.  Mild to moderate mitral valve stenosis with gradient of 6.0 mmHg.  Patient was evaluated by PT OT and plan for SNF.  Patient with intermittent tachycardia throughout admission requiring as needed IV metoprolol.  Multiple episode overnight.  Review of EKG and telemetry showing both atrial fibrillation and flutter intermittently throughout admission. Also has sinus tachycardia.  Patient did felt some fluttering sensation this morning but no chest pain or shortness of breath.  She cannot tell she had prior similar symptoms.  No orthopnea, PND, chest pain or shortness of breath.  No illicit drug use or tobacco smoking.  Troponin negative.  TSH and Free T4 are normal   Past Medical History:  Diagnosis Date   Hypertension     Past Surgical History:  Procedure Laterality Date   TONSILLECTOMY         Inpatient Medications: Scheduled Meds:  enoxaparin (LOVENOX) injection  80 mg Subcutaneous Daily   furosemide  40 mg Oral Daily   metoprolol tartrate  50 mg Oral BID   mupirocin cream   Topical Daily   Continuous Infusions:  cefTRIAXone (ROCEPHIN)  IV 1 g (02/23/22 1013)    PRN Meds: acetaminophen, bisacodyl, hydrALAZINE, liver oil-zinc oxide  Allergies:   No Known Allergies  Social History:   Social History   Socioeconomic History   Marital status: Legally Separated    Spouse name: Not on file   Number of children: Not on file   Years of education: Not on file   Highest education level: Not on file  Occupational History   Not on file  Tobacco Use   Smoking status: Never    Passive exposure: Past   Smokeless tobacco: Never  Substance and Sexual Activity   Alcohol use: Not Currently   Drug use: Never   Sexual activity: Not on file  Other Topics Concern   Not on file  Social History Narrative   Not on file   Social Determinants of Health   Financial Resource Strain: Not on file  Food Insecurity: Not on file  Transportation Needs: Not on file  Physical Activity: Not on file  Stress: Not on file  Social Connections: Not on file  Intimate Partner Violence: Not on file    Family History:   History reviewed. No pertinent family history.  Denies family history of heart disease  ROS:  Please see the history of present illness.  All other ROS reviewed and negative.     Physical Exam/Data:   Vitals:   02/23/22 1800 02/23/22 2108 02/24/22 0138 02/24/22 0624  BP:  (!) 148/91 127/74 128/64  Pulse: (!) 103 97 (!) 110 86  Resp:  18  17  Temp:  97.8 F (36.6 C) (!) 97.4 F (36.3 C) 98.2 F (36.8 C)  TempSrc:   Oral Oral  SpO2:  100% 97% 96%  Weight:      Height:        Intake/Output Summary (Last 24 hours) at 02/24/2022 1000 Last data filed at 02/23/2022 1809 Gross per 24 hour  Intake 360 ml  Output 950 ml  Net -590 ml      02/19/2022   12:07 AM  Last 3 Weights  Weight (lbs) 330 lb 11 oz  Weight (kg) 150 kg     Body mass index is 58.58 kg/m.  General:  Well nourished, well developed, in no acute distress HEENT: normal Neck: no JVD Vascular: No carotid bruits; Distal pulses 2+ bilaterally Cardiac:  normal S1, S2; RRR;  no murmur  Lungs:  clear to auscultation bilaterally, no wheezing, rhonchi or rales  Abd: soft, nontender, no hepatomegaly  Ext: + edema Musculoskeletal:  No deformities, BUE and BLE strength normal and equal Skin: warm and dry  Neuro:  CNs 2-12 intact, no focal abnormalities noted Psych:  Normal affect   EKG:  The EKG was personally reviewed and demonstrates:   Initial EKG on 8/18 with atrial fibrillation/personally reviewed. EKG on 8/21 with 2-1 atrial flutter and afib - personally reviewed  Telemetry:  Telemetry was personally reviewed and demonstrates: Sinus rhythm, intermittent atrial fibrillation/flutter  Relevant CV Studies:  Echo 02/19/22  1. Left ventricular ejection fraction, by estimation, is 60 to 65%. The  left ventricle has normal function. The left ventricle has no regional  wall motion abnormalities. There is mild left ventricular hypertrophy.  Left ventricular diastolic parameters  are consistent with Grade I diastolic dysfunction (impaired relaxation).   2. Right ventricular systolic function is normal. The right ventricular  size is normal.   3. Mild-mod MV stenosis, gradient 6.0 mmHg HR 92 bpm. The mitral valve is  abnormal. No evidence of mitral valve regurgitation.   4. The aortic valve was not well visualized. Aortic valve regurgitation  is not visualized.   Conclusion(s)/Recommendation(s): There's an increase MV gradient with  echodensity on the mitral valve. Resolution is challenging with TTE.  Recommend TEE.   Laboratory Data:  High Sensitivity Troponin:   Recent Labs  Lab 02/23/22 0620 02/23/22 0847  TROPONINIHS 12 10     Chemistry Recent Labs  Lab 02/18/22 1506 02/24/22 0822  NA 139 139  K 3.6 3.9  CL 103 104  CO2 28 28  GLUCOSE 106* 98  BUN 13 12  CREATININE 0.75 0.77  CALCIUM 8.8* 8.4*  MG  --  2.1  GFRNONAA >60 >60  ANIONGAP 8 7    Recent Labs  Lab 02/18/22 1506  PROT 7.6  ALBUMIN 3.5  AST 35  ALT 29  ALKPHOS 63  BILITOT  0.7   Lipids No results for input(s): "CHOL", "TRIG", "HDL", "LABVLDL", "LDLCALC", "CHOLHDL" in the last 168 hours.  Hematology Recent Labs  Lab 02/19/22 0052 02/20/22 0831 02/24/22 0822  WBC 11.8* 9.9 9.6  RBC 4.17 4.03 4.62  HGB 11.6* 11.2* 13.0  HCT 36.7 36.0 39.9  MCV 88.0 89.3 86.4  MCH 27.8 27.8 28.1  MCHC 31.6 31.1 32.6  RDW 15.5 15.8* 15.5  PLT 241 251 271   Thyroid  Recent Labs  Lab 02/23/22 0620  TSH 1.852  FREET4 1.04    BNP Recent Labs  Lab 02/18/22 1506  BNP 62.9     Radiology/Studies:  No results found.   Assessment and  Plan:   New onset atrial fibrillation/flutter with rapid ventricular rate -Patient has both atrial fibrillation and atrial flutter rhythm on monitor and on EKG.  Currently maintaining sinus rhythm.  Essentially asymptomatic with elevated heart rate. -Agree with adding metoprolol to tartrate 50 mg twice daily -Start anticoagulation given CHA2DS2-VASc risk of 3 -She may need monitor to quantify burden and antiarrhythmic as she is asymptomatic while out of rhythm  -TSH normal -Echocardiogram showed preserved LV function, grade 1 diastolic dysfunction and mild to moderate mitral stenosis  2.  Mitral stenosis -Follow-up with echocardiogram  3.  Left heel pain/cellulitis -Improving with antibiotic  4.  Chronic lymphedema/grade 1 diastolic dysfunction -Lasix started this admission with improvement -No orthopnea or PND  5.  Hypertension -Diagnosed many years ago but not taking antihypertensive prior to arrival. -Initially blood pressure elevated. -Continue beta-blocker  Risk Assessment/Risk Scores:          CHA2DS2-VASc Score = 3   This indicates a 3.2% annual risk of stroke. The patient's score is based upon: CHF History: 0 HTN History: 1 Diabetes History: 0 Stroke History: 0 Vascular Disease History: 0 Age Score: 1 Gender Score: 1     For questions or updates, please contact [place] Please consult  [email or web address] for contact info under    Signed, Leanor Kail, PA  02/24/2022 10:00 AM   Personally seen and examined. Agree with APP above with the following comments:  Briefly 76 yo F with a history of HTN, morbid obesity who presented for leg cellulitis with incidental PAF.  Found to have mild, likely degenerative mitral stenosis.  Patient notes that she has been doing well.  She presented with worsening left leg pain and swelling.  She was been living in a motel and is having issues with housing issues.  Rarely she has had heart fluttering.  During her hospitalization she has had fluttering.  No CP, No SOB, No syncope.  She had found to have atrial fibrillation.  No prior diagnosis.  No bleeding.  No falls.    Echo showed : Calcified anterior leaflet tip without diastolic doming, chordal thickening of fusion; unable to evaluate commissural fusion.  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  Unable able to planimeter.  Mean gradient 5 mm Hg.   She has a CT chest from 2009 without mitral valve calcium.  She denies prior rheumatic-infectious sequalea  Exam notable for mild diastolic rumble, RRR, morbid obesity.  EKG AF RVR (144) 02/22/22) Tele: intermittent PAF Personally reviewed relevant tests; 02/24/22: blood cultures negative  Would recommend  - Suspicious for mild degenerative mitral stenosis and PAF.  Would consider this non-valvular and ok with DOAC.  Will treat with beta blockers.  Will repeat echo; if concerns for rheumatic disease OR if concern for non calcific lesions we have discussed doing TEE. - Consult was called for evaluation prior to patient discharge. She has f/u with me 04/26/22.  After this we will repeat echocardiogram  Rudean Haskell, MD [place]  [address] [place], [place] [address] [phone]  1:07 PM

## 2022-02-24 NOTE — Progress Notes (Signed)
RN got order to place pt back on tele because of new onset AFIB/RVR. Telemetry notified RN  at 01:27 that pt went into A-flutter.

## 2022-02-24 NOTE — Discharge Summary (Signed)
Physician Discharge Summary   Patient: Sandra [name] MRN: [id number] DOB: 06-10-1946  Admit date:     02/18/2022  Discharge date: 02/24/22  Discharge Physician: Edwin Dada   PCP: Patient, No Pcp Per     Recommendations at discharge:  Follow up with Cardiology in 2-3 weeks Take antibiotics for 3 more days then stop Check CBC and BMP in 1 week on new apixaban and furosemide Prescribe furosemide, metoprolol, apixaban, and potassium at discharge from SNF Please refer for outpatient polysomnogram     Discharge Diagnoses: Principal Problem:   Cellulitis of left heel Active Problems:   Paroxysmal atrial fibrillation (HCC)   Essential hypertension   Lymphedema   Obesity, Class III, BMI 40-49.9 (morbid obesity) (HCC)   Chronic diastolic CHF (congestive heart failure) [facility])         Hospital Course: Sandra [name] is a 76 y.o. F with HTN not on meds, who presented with bilateral leg swelling.  In the ER she was noted to be tachycardic, hypertensive, and with leukocytosis.      Cellulitis Treated with antibiotics, nearly resolved.  Discharged to complete 3 more days.    New onset paroxysmal atrial fibrillation New diagnosis.  CHA2DS2-Vasc 4. TSH normal, echocardiogram showed mild degenerative mitral stenosis, she has no history of rheumatic heart disease, do not think this constitutes valvular atrial fibrillation.  Started on metoprolol and apixaban, in sinus at discharge.  Cardiology follow up arranged.  Mitral valve disease See above re: stenosis.  There is a low concern for bacteremia/endocarditis by previous providers and by me, but given the concern for foot infection and the possibly thickened mitral valve on echo, will obtain two blood cultures.  If positive, would need readmission and work up for endocarditis.  Lymphedema Likely multifactorial from morbid obesity, venous insufficiency, and chronic diastolic CHF. - Recommend outpatient  polysomnogram  Chronic diastolic CHF Essential hypertension Started on new Lasix and metoprolol here.  Echo shows preserved EF, grade I DD.  BP controlled at d/c.                The [facility] Controlled Substances Registry was not operation and could not be reviewed at discharge.  Consultants: Cardiology Procedures performed: Echocardiogram  Disposition: Skilled nursing facility Diet recommendation:  Discharge Diet Orders (From admission, onward)     Start     Ordered   02/24/22 0000  Diet - low sodium heart healthy        02/24/22 1037             DISCHARGE MEDICATION: Allergies as of 02/24/2022   No Known Allergies      Medication List     STOP taking these medications    aspirin 325 MG tablet       TAKE these medications    apixaban 5 MG Tabs tablet Commonly known as: ELIQUIS Take 1 tablet (5 mg total) by mouth 2 (two) times daily.   cephALEXin 500 MG capsule Commonly known as: KEFLEX Take 1 capsule (500 mg total) by mouth 3 (three) times daily for 3 days.   furosemide 20 MG tablet Commonly known as: LASIX Take 1 tablet (20 mg total) by mouth daily.   metoprolol tartrate 50 MG tablet Commonly known as: LOPRESSOR Take 1 tablet (50 mg total) by mouth 2 (two) times daily.   potassium chloride 10 MEQ tablet Commonly known as: KLOR-CON M Take 1 tablet (10 mEq total) by mouth daily.  Discharge Care Instructions  (From admission, onward)           Start     Ordered   02/24/22 0000  Discharge wound care:       Comments: Apply Bactroban cream to the left heel daily, then may cover with a foam dressing Change foam dressing every 3 days or as needed for soiling   02/24/22 1037            Contact information for follow-up providers     Primary care doctor Follow up.   Why: You must schedule to see a primary care doctor             Contact information for after-discharge care     Destination      [facility] .   Service: Skilled Nursing Contact information: [address] [place]                     Discharge Instructions     Diet - low sodium heart healthy   Complete by: As directed    Discharge instructions   Complete by: As directed    From Dr. Nelva Bush were admitted for cellulitis You were treated with antibiotics You had an MRI that confirmed infection/cellulitis, but ruled out infection in the bone (thankfully, there was none there)  You should complete antibiotics with 3 more days of cephalexin 3 times daily  In addition you were diagnosed with atrial fibrillation This is a new abnormal heart rhythm, which requires controlling the heart rate, and a blood thinner To control the heart rate you should start the new medicine metoprolol 50 mg twice daily To prevent this risk of stroke from blood clots forming in the heart, you should take apixaban/Eliquis 5 mg twice daily from now on You should also take furosemide 20 mg daily to keep fluid off  You should follow up with the heart doctor as soon as they schedule you.  It is also imperative if you do not already have a primary care doctor, that you establish with   Have a rehab center check your labs in 1 week   Discharge wound care:   Complete by: As directed    Apply Bactroban cream to the left heel daily, then may cover with a foam dressing Change foam dressing every 3 days or as needed for soiling   Increase activity slowly   Complete by: As directed        Discharge Exam: Filed Weights   02/19/22 0007  Weight: (!) 150 kg    General: Pt is alert, awake, not in acute distress Cardiovascular: RRR, nl S1-S2, no murmurs appreciated.   No LE edema.   Respiratory: Normal respiratory rate and rhythm.  CTAB without rales or wheezes. Abdominal: Abdomen soft and non-tender.  No distension or HSM.   Neuro/Psych: Strength  symmetric in upper and lower extremities.  Judgment and insight appear normal.   Condition at discharge: good  The results of significant diagnostics from this hospitalization (including imaging, microbiology, ancillary and laboratory) are listed below for reference.   Imaging Studies: VAS [place] LOWER EXTREMITY VENOUS (DVT) (7a-7p)  Result Date: 02/21/2022  Lower Venous DVT Study Patient Name:  JAKE ZARRELLA  Date of Exam:   02/18/2022 Medical Rec #: [id number]          Accession #:    [id number] Date of Birth: July 13, 1945  Patient Gender: F Patient Age:   42 years Exam Location:  [facility] Procedure:      VAS [place] LOWER EXTREMITY VENOUS (DVT) Referring Phys: COOPER ROBBINS --------------------------------------------------------------------------------  Indications: Pain.  Limitations: Body habitus. Comparison Study: No previous exam noted. Performing Technologist: Bobetta Lime BS, RVT  Examination Guidelines: A complete evaluation includes B-mode imaging, spectral Doppler, color Doppler, and power Doppler as needed of all accessible portions of each vessel. Bilateral testing is considered an integral part of a complete examination. Limited examinations for reoccurring indications may be performed as noted. The reflux portion of the exam is performed with the patient in reverse Trendelenburg.  +---------+---------------+---------+-----------+----------+-------------------+ RIGHT    CompressibilityPhasicitySpontaneityPropertiesThrombus Aging      +---------+---------------+---------+-----------+----------+-------------------+ CFV      Full           Yes      Yes                                      +---------+---------------+---------+-----------+----------+-------------------+ SFJ      Full                                                             +---------+---------------+---------+-----------+----------+-------------------+ FV Prox  Full                                                              +---------+---------------+---------+-----------+----------+-------------------+ FV Mid   Full                                         Not well visualized +---------+---------------+---------+-----------+----------+-------------------+ FV DistalFull                                         Not well visualized +---------+---------------+---------+-----------+----------+-------------------+ PFV      Full                                                             +---------+---------------+---------+-----------+----------+-------------------+ POP      Full           Yes      Yes                                      +---------+---------------+---------+-----------+----------+-------------------+ PTV                                                   Not well visualized +---------+---------------+---------+-----------+----------+-------------------+  PERO                                                  Not well visualized +---------+---------------+---------+-----------+----------+-------------------+   Right Technical Findings: The right mid and distal femoral vein segments were not well visualized by B-mode secondary to body habitus, however they appear patent by color imaging. The right calf veins were not well visualized with B-mode or color imaging.  +---------+---------------+---------+-----------+----------+-------------------+ LEFT     CompressibilityPhasicitySpontaneityPropertiesThrombus Aging      +---------+---------------+---------+-----------+----------+-------------------+ CFV      Full           Yes      Yes                                      +---------+---------------+---------+-----------+----------+-------------------+ SFJ      Full                                                             +---------+---------------+---------+-----------+----------+-------------------+ FV Prox  Full                                                              +---------+---------------+---------+-----------+----------+-------------------+ FV Mid                                                Not well visualized +---------+---------------+---------+-----------+----------+-------------------+ FV Distal                                             Not well visualized +---------+---------------+---------+-----------+----------+-------------------+ PFV      Full                                                             +---------+---------------+---------+-----------+----------+-------------------+ POP      Full           Yes      Yes                                      +---------+---------------+---------+-----------+----------+-------------------+ PTV                                                   Not well visualized +---------+---------------+---------+-----------+----------+-------------------+ PERO  Not well visualized +---------+---------------+---------+-----------+----------+-------------------+   Left Technical Findings: The left mid and distal femoral vein segments were not well visualized by B-mode secondary to body habitus, however they appear patent by color imaging. The left calf veins were not well visualized with B-mode or color imaging.   Summary: BILATERAL: - No evidence of deep vein thrombosis seen in the lower extremities, bilaterally. -No evidence of popliteal cyst, bilaterally. RIGHT: - Portions of this examination were limited- see technologist comments above.  LEFT: - Portions of this examination were limited- see technologist comments above.  *See table(s) above for measurements and observations. Electronically signed by Jamelle Haring on 02/21/2022 at 2:45:14 PM.    Final    MR FOOT LEFT W WO CONTRAST  Result Date: 02/19/2022 CLINICAL DATA:  Callus on bottom of the heel. Marked soft tissue swelling. EXAM:  MRI OF THE LEFT HINDFOOT WITHOUT AND WITH CONTRAST TECHNIQUE: Multiplanar, multisequence MR imaging of the left was performed both before and after administration of intravenous contrast. CONTRAST:  27mL GADAVIST GADOBUTROL 1 MMOL/ML IV SOLN COMPARISON:  None Available. FINDINGS: Bones/Joint/Cartilage Bone marrow signal is within normal limits. No evidence of osteomyelitis. No fracture or dislocation. No joint effusion. Ligaments Ligaments of the medial and lateral aspect of the ankle are within normal limits. Muscles and Tendons Muscles are normal in bulk. Increased intramuscular signal of the plantar muscles secondary to myopathy/myositis. Mild thickening of the Achilles tendon suggesting tendinosis without discrete tear. Tendons of the flexor, extensor and peroneal compartments appear intact. Soft tissues There is marked subcutaneous soft tissue edema and skin thickening consistent with severe cellulitis prominent about the dorsum and posterior aspect of the knee. IMPRESSION: 1. Skin thickening and marked subcutaneous soft tissue edema about the ankle/foot consistent with severe cellulitis. No abnormal enhancement to suggest abscess or fluid collection. 2. 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 The remaining muscles and tendons appear maintained. No intramuscular fluid collection. 3. Marrow signal is within normal limits. No evidence of osteomyelitis or fracture/dislocation. Electronically Signed   By: Keane Police D.O.   On: 02/19/2022 22:55   ECHOCARDIOGRAM COMPLETE  Result Date: 02/19/2022    ECHOCARDIOGRAM REPORT   Patient Name:   ROJEAN COLLINGTON Date of Exam: 02/19/2022 Medical Rec #:  [id number]         Height:       63.0 in Accession #:    [id number]        Weight:       330.7 lb Date of Birth:  December 07, 1945         BSA:          2.394 m Patient Age:    76 years          BP:           127/58 mmHg Patient Gender: F                 HR:           92 bpm. Exam Location:  Inpatient Procedure: 2D Echo,  Cardiac Doppler, Color Doppler and Intracardiac            Opacification Agent Indications:    Congestive Heart Failure I50.9  History:        Patient has no prior history of Echocardiogram examinations.                 Risk Factors:Hypertension.  Sonographer:    Eartha Inch Referring Phys: [id number] RAVI PAHWANI  Sonographer Comments: Suboptimal subcostal window, suboptimal  parasternal window and suboptimal apical window. Image acquisition challenging due to patient body habitus and Image acquisition challenging due to respiratory motion. IMPRESSIONS  1. Left ventricular ejection fraction, by estimation, is 60 to 65%. The left ventricle has normal function. The left ventricle has no regional wall motion abnormalities. There is mild left ventricular hypertrophy. Left ventricular diastolic parameters are consistent with Grade I diastolic dysfunction (impaired relaxation).  2. Right ventricular systolic function is normal. The right ventricular size is normal.  3. Mild-mod MV stenosis, gradient 6.0 mmHg HR 92 bpm. The mitral valve is abnormal. No evidence of mitral valve regurgitation.  4. The aortic valve was not well visualized. Aortic valve regurgitation is not visualized. Conclusion(s)/Recommendation(s): There's an increase MV gradient with echodensity on the mitral valve. Resolution is challenging with TTE. Recommend TEE. FINDINGS  Left Ventricle: Left ventricular ejection fraction, by estimation, is 60 to 65%. The left ventricle has normal function. The left ventricle has no regional wall motion abnormalities. Definity contrast agent was given IV to delineate the left ventricular  endocardial borders. The left ventricular internal cavity size was normal in size. There is mild left ventricular hypertrophy. Left ventricular diastolic parameters are consistent with Grade I diastolic dysfunction (impaired relaxation). Right Ventricle: The right ventricular size is normal. Right ventricular systolic function is  normal. Left Atrium: Left atrial size was normal in size. Right Atrium: Right atrial size was normal in size. Pericardium: The pericardium was not well visualized. Mitral Valve: Mild-mod MV stenosis, gradient 6.0 mmHg HR 92 bpm. The mitral valve is abnormal. No evidence of mitral valve regurgitation. MV peak gradient, 10.1 mmHg. The mean mitral valve gradient is 6.0 mmHg. Tricuspid Valve: Tricuspid valve regurgitation is not demonstrated. Aortic Valve: The aortic valve was not well visualized. Aortic valve regurgitation is not visualized. Aortic valve mean gradient measures 7.0 mmHg. Aortic valve peak gradient measures 12.2 mmHg. Aortic valve area, by VTI measures 3.01 cm. Pulmonic Valve: Pulmonic valve regurgitation is not visualized. Aorta: The aortic root is normal in size and structure.  LEFT VENTRICLE PLAX 2D LVIDd:         3.80 cm   Diastology LVIDs:         2.70 cm   LV e' medial:    7.72 cm/s LV PW:         1.10 cm   LV E/e' medial:  18.5 LV IVS:        1.10 cm   LV e' lateral:   5.98 cm/s LVOT diam:     2.00 cm   LV E/e' lateral: 23.9 LV SV:         93 LV SV Index:   39 LVOT Area:     3.14 cm  RIGHT VENTRICLE RV S prime:     13.30 cm/s TAPSE (M-mode): 1.8 cm LEFT ATRIUM             Index        RIGHT ATRIUM          Index LA diam:        3.80 cm 1.59 cm/m   RA Area:     9.35 cm LA Vol (A2C):   53.4 ml 22.30 ml/m  RA Volume:   16.60 ml 6.93 ml/m LA Vol (A4C):   49.9 ml 20.84 ml/m LA Biplane Vol: 52.0 ml 21.72 ml/m  AORTIC VALVE AV Area (Vmax):    2.76 cm AV Area (Vmean):   2.71 cm AV Area (VTI):     3.01  cm AV Vmax:           175.00 cm/s AV Vmean:          130.000 cm/s AV VTI:            0.309 m AV Peak Grad:      12.2 mmHg AV Mean Grad:      7.0 mmHg LVOT Vmax:         154.00 cm/s LVOT Vmean:        112.000 cm/s LVOT VTI:          0.296 m LVOT/AV VTI ratio: 0.96  AORTA Ao Root diam: 2.90 cm MITRAL VALVE MV Area (PHT): 2.07 cm     SHUNTS MV Area VTI:   2.61 cm     Systemic VTI:  0.30 m MV Peak  grad:  10.1 mmHg    Systemic Diam: 2.00 cm MV Mean grad:  6.0 mmHg MV Vmax:       1.59 m/s MV Vmean:      121.0 cm/s MV Decel Time: 366 msec MV E velocity: 143.00 cm/s MV A velocity: 158.00 cm/s MV E/A ratio:  0.91 [place] signed by Carolan Clines Signature Date/Time: 02/19/2022/4:02:43 PM    Final    DG Foot Complete Left  Result Date: 02/18/2022 CLINICAL DATA:  Ongoing left heel pain. EXAM: LEFT FOOT - COMPLETE 3+ VIEW COMPARISON:  None Available. FINDINGS: Diffuse soft tissue swelling, most pronounced dorsally. Moderately large posterior and inferior calcaneal enthesophytes. No fractures, bone destruction or periosteal reaction seen. No soft tissue gas. IMPRESSION: 1. Moderately large posterior and inferior calcaneal enthesophytes. 2. Diffuse soft tissue swelling, most pronounced dorsally. Electronically Signed   By: Beckie Salts M.D.   On: 02/18/2022 16:23    Microbiology: No results found for this or any previous visit.  Labs: CBC: Recent Labs  Lab 02/18/22 1506 02/19/22 0052 02/20/22 0831 02/24/22 0822  WBC 14.6* 11.8* 9.9 9.6  NEUTROABS 11.6*  --  6.7  --   HGB 13.3 11.6* 11.2* 13.0  HCT 40.9 36.7 36.0 39.9  MCV 85.9 88.0 89.3 86.4  PLT 286 241 251 271   Basic Metabolic Panel: Recent Labs  Lab 02/18/22 1506 02/24/22 0822  NA 139 139  K 3.6 3.9  CL 103 104  CO2 28 28  GLUCOSE 106* 98  BUN 13 12  CREATININE 0.75 0.77  CALCIUM 8.8* 8.4*  MG  --  2.1   Liver Function Tests: Recent Labs  Lab 02/18/22 1506  AST 35  ALT 29  ALKPHOS 63  BILITOT 0.7  PROT 7.6  ALBUMIN 3.5   CBG: No results for input(s): "GLUCAP" in the last 168 hours.  Discharge time spent: approximately 35 minutes spent on discharge counseling, evaluation of patient on day of discharge, and coordination of discharge planning with nursing, social work, pharmacy and case management  Signed: Alberteen Sam, MD [facility] 02/24/2022

## 2022-02-24 NOTE — Progress Notes (Signed)
Occupational Therapy Treatment Patient Details Name: Sandra [name] MRN: [id number] DOB: 09/18/1945 Today's Date: 02/24/2022   History of present illness Patient is a 76 year old female with medical history significant of hypertension presented to the ED complaining of left foot/heel pain and bilateral leg swelling.  EMS reported patient having bedbugs.  Tachycardic and hypertensive on arrival to the ED. X-ray of left foot moderately large posterior and inferior calcaneal enthesophytes and diffuse soft tissue swelling most pronounced dorsally.  X-ray negative for osteomyelitis.  MRI of left foot showed Skin thickening and marked subcutaneous soft tissue edema about  the ankle/foot consistent with severe cellulitis, and achilles tendinosis.  Bilateral lower extremity Dopplers negative for DVT.   OT comments  Pt was seen for OT treatment session with focus on bed mobility, sitting at EOB, activity tolerance in preparation for increased participation in ADL's and functional transfers. Pt declined toilet tranfer to 3:1 but was agreeable to sitting up at EOB and changing out bed linens. Pt was noted to sit for ~14 min prior to getting back to bed. Pt was mod I with supine to sit with increased time and verbal encouragement. She was Min-mod A for LLE when getting back to bed and +2 Assist for repositioning in bed. Pt plans to d/c to SNF rehab later today per her report and chart review.   Recommendations for follow up therapy are one component of a multi-disciplinary discharge planning process, led by the attending physician.  Recommendations may be updated based on patient status, additional functional criteria and insurance authorization.    Follow Up Recommendations  Skilled nursing-short term rehab (<3 hours/day)    Assistance Recommended at Discharge Frequent or constant Supervision/Assistance  Patient can return home with the following  Two people to help with walking and/or transfers;Two people  to help with bathing/dressing/bathroom;Direct supervision/assist for medications management;Help with stairs or ramp for entrance;Assist for transportation;Direct supervision/assist for financial management;Assistance with cooking/housework   Equipment Recommendations  Other (comment) (Defer to next venue)    Recommendations for Other Services      Precautions / Restrictions Precautions Precautions: Fall Precaution Comments: denies falls in past 6 months       Mobility Bed Mobility Overal bed mobility: Needs Assistance Bed Mobility: Supine to Sit, Sit to Supine, Rolling Rolling: Min assist   Supine to sit: HOB elevated, Modified independent (Device/Increase time) Sit to supine: Min assist   General bed mobility comments: used bedrail for supine to sit, increased time/effort; physical assist to get Left LE back into bed. Patient Response: Cooperative  Transfers  Pt states "I don't walk"     Balance Overall balance assessment: Needs assistance Sitting-balance support: Feet supported, No upper extremity supported, Bilateral upper extremity supported Sitting balance-Leahy Scale: Good     ADL either performed or assessed with clinical judgement   ADL Overall ADL's : Needs assistance/impaired     General ADL Comments: Pt was seen for OT treatment session with focus on bed mobility, sitting at EOB, activity tolerance in preparation for increased participation in ADL's and functional transfers. Pt declined toilet tranfer to 3:1 but was agreeable to sitting up at EOB and changing out bed linens. Pt was noted to sit for ~14 min prior to getting back to bed. Pt was mod I with supine to sit with increased time and verbal encouragement. She was Min-mod A for LLE when getting back to bed and +2 Assist for repositioning in bed. Pt plans to d/c to SNF rehab later today  per her report and chart review.    Extremity/Trunk Assessment Upper Extremity Assessment Upper Extremity Assessment:  Overall WFL for tasks assessed   Lower Extremity Assessment Lower Extremity Assessment: Defer to PT evaluation   Cervical / Trunk Assessment Cervical / Trunk Assessment: Normal    Vision Baseline Vision/History: 1 Wears glasses Patient Visual Report: No change from baseline            Cognition Arousal/Alertness: Awake/alert Behavior During Therapy: WFL for tasks assessed/performed Overall Cognitive Status: Within Functional Limits for tasks assessed                General Comments Pt with "raw patch of skin" on inner thigh & buttocks that RN is aware of. Bed pad/linens were changed to assist in keeping skin dry and pt was rotated/repositioned in bed with RN assist.    Pertinent Vitals/ Pain       Pain Assessment Pain Assessment: Faces Faces Pain Scale: Hurts little more Pain Location: buttocks w/ movement Pain Descriptors / Indicators: Grimacing, Guarding Pain Intervention(s): Monitored during session, Limited activity within patient's tolerance, Repositioned  Home Living  Refer to initial OT Eval for details      Prior Functioning/Environment   Refer to initial OT Eval for details   Frequency  Min 2X/week        Progress Toward Goals  OT Goals(current goals can now be found in the care plan section)  Progress towards OT goals: Progressing toward goals  Acute Rehab OT Goals Patient Stated Goal: To get better and go back home OT Goal Formulation: With patient Time For Goal Achievement: 03/06/22 Potential to Achieve Goals: Fair  Plan Discharge plan remains appropriate       AM-PAC OT "6 Clicks" Daily Activity     Outcome Measure   Help from another person eating meals?: A Little Help from another person taking care of personal grooming?: A Little Help from another person toileting, which includes using toliet, bedpan, or urinal?: Total Help from another person bathing (including washing, rinsing, drying)?: A Lot Help from another person to put on and  taking off regular upper body clothing?: A Little Help from another person to put on and taking off regular lower body clothing?: Total 6 Click Score: 13    End of Session    OT Visit Diagnosis: Unsteadiness on feet (R26.81);Other abnormalities of gait and mobility (R26.89);Muscle weakness (generalized) (M62.81);Pain Pain - Right/Left:  (buttocks/inner thigh "Raw patch of skin" per pt) Pain - part of body:  (see above)   Activity Tolerance Patient tolerated treatment well   Patient Left in bed;with call bell/phone within reach;with nursing/sitter in room   Nurse Communication Other (comment) (RN in room to assist with repositioning in bed at end of session)        Time: 4970-2637 OT Time Calculation (min): 28 min  Charges: OT General Charges $OT Visit: 1 Visit OT Treatments $Self Care/Home Management : 8-22 mins $Therapeutic Activity: 8-22 mins   Felisia Balcom Beth Dixon, OTR/L 02/24/2022, 11:58 AM

## 2022-02-24 NOTE — TOC Transition Note (Addendum)
Transition of Care [facility]) - CM/SW Discharge Note   Patient Details  Name: Sandra [name] MRN: [id number] Date of Birth: March 21, 1946  [facility] CM/SW Contact:  Lavenia Atlas, RN Phone Number: 02/24/2022, 10:47 AM   Clinical Narrative:   Sherron Monday with Janie with [facility] bed is ready. Report can be called to [phone], room # 218. TOC will coordinate PTAR transportation to SNF and leave folder at nursing station.  MD & RN notified.  - 12:22pm PTAR has been called, RN, MD notified.   Final next level of care: Skilled Nursing Facility Barriers to Discharge: Continued Medical Work up   Patient Goals and CMS Choice Patient states their goals for this hospitalization and ongoing recovery are:: To go to SNF for rehab then return back home. CMS Medicare.gov Compare Post Acute Care list provided to:: Patient Choice offered to / list presented to : Patient  Discharge Placement                       Discharge Plan and Services   Discharge Planning Services: CM Consult Post Acute Care Choice: Skilled Nursing Facility                               Social Determinants of Health (SDOH) Interventions     Readmission Risk Interventions     No data to display

## 2022-02-24 NOTE — Progress Notes (Signed)
Attempted to give report, Rosalita Chessman, Diplomatic Services operational officer states she will have nurse call me back for report.

## 2022-02-24 NOTE — Hospital Course (Signed)
Sandra [name] is a 76 y.o. female with medical history significant of hypertension presented to the ED complaining of left foot/heel pain and bilateral leg swelling.  EMS reported patient having bedbugs.  Tachycardic and hypertensive on arrival to the ED.  Afebrile.  Labs significant for WBC 14.6, lactic acid pending.  X-ray of left foot moderately large posterior and inferior calcaneal enthesophytes and diffuse soft tissue swelling most pronounced dorsally.  X-ray negative for osteomyelitis.  MRI of left foot pending.  Bilateral lower extremity Dopplers negative for DVT. Patient was given ibuprofen.   Patient states about 2 months ago she noticed an abrasion on her left heel which she thinks is due to wearing plastic slippers.  States over time a callus formed in this area and then the skin was peeling off.  She has been soaking this foot in water and hydrogen peroxide and applying petroleum jelly.  She does report bilateral lower extremity edema for several months but believes lately the left foot has been more swollen.  She has not been able to walk much as she experiences significant pain every time she steps on her left foot.  Denies any any open cuts or drainage from this area.  She sits in a chair most of the day and believes that is contributing to her bilateral lower extremity edema as she does not elevate her legs.  She reports history of hypertension but has been off of medications since 2009.  Denies any other medical problems.  Denies fevers or chills.  Denies cough, shortness of breath, or chest pain.  No other complaints.

## 2022-03-01 LAB — CULTURE, BLOOD (ROUTINE X 2)
Culture: NO GROWTH
Culture: NO GROWTH

## 2022-04-26 ENCOUNTER — Ambulatory Visit: Payer: Medicare Other | Admitting: Internal Medicine

## 2022-04-26 NOTE — Progress Notes (Deleted)
Cardiology Office Note:    Date:  04/26/2022   ID:  Sandra [name], DOB 1945-10-04, MRN [id number]  PCP:  Patient, No Pcp Per   [facility] Providers Cardiologist:  None { Click to update primary MD,subspecialty MD or APP then REFRESH:1}    Referring MD: No ref. provider found   CC: Follow up Afib Consult  History of Present Illness:    Sandra [name] is a 76 y.o. 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  Straetd on DOAC and beta blockers.  We had planned repeat limited echo for MV characterization with low threshold for TEE.  Patient notes that she is doing ***.   Since last visit notes *** . There are no*** interval hospital/ED visit.    No chest pain or pressure ***.  No SOB/DOE*** and no PND/Orthopnea***.  No weight gain or leg swelling***.  No palpitations or syncope ***.  Ambulatory blood pressure ***.   Past Medical History:  Diagnosis Date   Hypertension     Past Surgical History:  Procedure Laterality Date   TONSILLECTOMY      Current Medications: No outpatient medications have been marked as taking for the 04/26/22 encounter (Appointment) with Werner Lean, MD.     Allergies:   Patient has no known allergies.   Social History   Socioeconomic History   Marital status: Legally Separated    Spouse name: Not on file   Number of children: Not on file   Years of education: Not on file   Highest education level: Not on file  Occupational History   Not on file  Tobacco Use   Smoking status: Never    Passive exposure: Past   Smokeless tobacco: Never  Substance and Sexual Activity   Alcohol use: Not Currently   Drug use: Never   Sexual activity: Not on file  Other Topics Concern   Not on file  Social History Narrative   Not on file   Social Determinants of Health   Financial Resource Strain: Not on file   Food Insecurity: Not on file  Transportation Needs: Not on file  Physical Activity: Not on file  Stress: Not on file  Social Connections: Not on file     Family History: The patient's ***family history is not on file.  ROS:   Please see the history of present illness.    *** All other systems reviewed and are negative.  EKGs/Labs/Other Studies Reviewed:    The following studies were reviewed today:  EKG:  EKG is *** ordered today.  The ekg ordered today demonstrates *** 04/26/22: ***  No results found for this or any previous visit from the past 3650 days.   No results found for this or any previous visit from the past 3650 days.   No results found for this or any previous visit from the past 3650 days.   No results found for this or any previous visit from the past 3650 days.   No results found for this or any previous visit from the past 3650 days.   ECHO COMPLETE WITH IMAGING ENHANCING AGENT 02/19/2022  Narrative ECHOCARDIOGRAM REPORT    Patient Name:   Sandra [name] Date of Exam: 02/19/2022 Medical Rec #:  [id number]         Height:       63.0 in Accession #:  [id number]        Weight:       330.7 lb Date of Birth:  09-02-1945         BSA:          2.394 m Patient Age:    5 years          BP:           127/58 mmHg Patient Gender: F                 HR:           92 bpm. Exam Location:  Inpatient  Procedure: 2D Echo, Cardiac Doppler, Color Doppler and Intracardiac Opacification Agent  Indications:    Congestive Heart Failure I50.9  History:        Patient has no prior history of Echocardiogram examinations. Risk Factors:Hypertension.  Sonographer:    Eartha Inch Referring Phys: [id number] RAVI PAHWANI   Sonographer Comments: Suboptimal subcostal window, suboptimal parasternal window and suboptimal apical window. Image acquisition challenging due to patient body habitus and Image acquisition challenging due to respiratory motion. IMPRESSIONS   1.  Left ventricular ejection fraction, by estimation, is 60 to 65%. The left ventricle has normal function. The left ventricle has no regional wall motion abnormalities. There is mild left ventricular hypertrophy. Left ventricular diastolic parameters are consistent with Grade I diastolic dysfunction (impaired relaxation). 2. Right ventricular systolic function is normal. The right ventricular size is normal. 3. Mild-mod MV stenosis, gradient 6.0 mmHg HR 92 bpm. The mitral valve is abnormal. No evidence of mitral valve regurgitation. 4. The aortic valve was not well visualized. Aortic valve regurgitation is not visualized.  Conclusion(s)/Recommendation(s): There's an increase MV gradient with echodensity on the mitral valve. Resolution is challenging with TTE. Recommend TEE.  FINDINGS Left Ventricle: Left ventricular ejection fraction, by estimation, is 60 to 65%. The left ventricle has normal function. The left ventricle has no regional wall motion abnormalities. Definity contrast agent was given IV to delineate the left ventricular endocardial borders. The left ventricular internal cavity size was normal in size. There is mild left ventricular hypertrophy. Left ventricular diastolic parameters are consistent with Grade I diastolic dysfunction (impaired relaxation).  Right Ventricle: The right ventricular size is normal. Right ventricular systolic function is normal.  Left Atrium: Left atrial size was normal in size.  Right Atrium: Right atrial size was normal in size.  Pericardium: The pericardium was not well visualized.  Mitral Valve: Mild-mod MV stenosis, gradient 6.0 mmHg HR 92 bpm. The mitral valve is abnormal. No evidence of mitral valve regurgitation. MV peak gradient, 10.1 mmHg. The mean mitral valve gradient is 6.0 mmHg.  Tricuspid Valve: Tricuspid valve regurgitation is not demonstrated.  Aortic Valve: The aortic valve was not well visualized. Aortic valve regurgitation is not  visualized. Aortic valve mean gradient measures 7.0 mmHg. Aortic valve peak gradient measures 12.2 mmHg. Aortic valve area, by VTI measures 3.01 cm.  Pulmonic Valve: Pulmonic valve regurgitation is not visualized.  Aorta: The aortic root is normal in size and structure.   LEFT VENTRICLE PLAX 2D LVIDd:         3.80 cm   Diastology LVIDs:         2.70 cm   LV e' medial:    7.72 cm/s LV PW:         1.10 cm   LV E/e' medial:  18.5 LV IVS:        1.10 cm   LV e' lateral:  5.98 cm/s LVOT diam:     2.00 cm   LV E/e' lateral: 23.9 LV SV:         93 LV SV Index:   39 LVOT Area:     3.14 cm   RIGHT VENTRICLE RV S prime:     13.30 cm/s TAPSE (M-mode): 1.8 cm  LEFT ATRIUM             Index        RIGHT ATRIUM          Index LA diam:        3.80 cm 1.59 cm/m   RA Area:     9.35 cm LA Vol (A2C):   53.4 ml 22.30 ml/m  RA Volume:   16.60 ml 6.93 ml/m LA Vol (A4C):   49.9 ml 20.84 ml/m LA Biplane Vol: 52.0 ml 21.72 ml/m AORTIC VALVE AV Area (Vmax):    2.76 cm AV Area (Vmean):   2.71 cm AV Area (VTI):     3.01 cm AV Vmax:           175.00 cm/s AV Vmean:          130.000 cm/s AV VTI:            0.309 m AV Peak Grad:      12.2 mmHg AV Mean Grad:      7.0 mmHg LVOT Vmax:         154.00 cm/s LVOT Vmean:        112.000 cm/s LVOT VTI:          0.296 m LVOT/AV VTI ratio: 0.96  AORTA Ao Root diam: 2.90 cm  MITRAL VALVE MV Area (PHT): 2.07 cm     SHUNTS MV Area VTI:   2.61 cm     Systemic VTI:  0.30 m MV Peak grad:  10.1 mmHg    Systemic Diam: 2.00 cm MV Mean grad:  6.0 mmHg MV Vmax:       1.59 m/s MV Vmean:      121.0 cm/s MV Decel Time: 366 msec MV E velocity: 143.00 cm/s MV A velocity: 158.00 cm/s MV E/A ratio:  0.91  Landscape architect signed by Phineas Inches Signature Date/Time: 02/19/2022/4:02:43 PM    Final   No results found for this or any previous visit from the past 3650 days.   No results found for this or any previous visit from the past 3650  days.   No results found for this or any previous visit from the past 3650 days.   No results found for this or any previous visit from the past 3650 days.   No results found for this or any previous visit from the past 3650 days.   No results found for this or any previous visit from the past 3650 days.   No results found for this or any previous visit from the past 3650 days.   No results found for this or any previous visit from the past 3650 days.   No results found for this or any previous visit from the past 3650 days.   No results found for this or any previous visit from the past 3650 days.   No results found for this or any previous visit from the past 3650 days.   No results found for this or any previous visit from the past 3650 days.     Recent Labs: 02/18/2022: ALT 29; B Natriuretic Peptide 62.9 02/23/2022: TSH 1.852 02/24/2022: BUN 12;  Creatinine, Ser 0.77; Hemoglobin 13.0; Magnesium 2.1; Platelets 271; Potassium 3.9; Sodium 139  Recent Lipid Panel    Component Value Date/Time   CHOL 220 (H) 02/15/2008 2050   TRIG 128 02/15/2008 2050   HDL 41 02/15/2008 2050   CHOLHDL 5.4 Ratio 02/15/2008 2050   VLDL 26 02/15/2008 2050   LDLCALC 153 (H) 02/15/2008 2050     Risk Assessment/Calculations:    CHA2DS2-VASc Score = 3  {Confirm score is correct.  If not, click here to update score.  REFRESH note.  :1} This indicates a 3.2% annual risk of stroke. The patient's score is based upon: CHF History: 0 HTN History: 1 Diabetes History: 0 Stroke History: 0 Vascular Disease History: 0 Age Score: 1 Gender Score: 1   {This patient has a significant risk of stroke if diagnosed with atrial fibrillation.  Please consider VKA or DOAC agent for anticoagulation if the bleeding risk is acceptable.   You can also use the [facility] for documentation.   :[id number]  No BP recorded.  {Refresh Note OR Click here to enter BP  :1}***         Physical Exam:     VS:  There were no vitals taken for this visit.    Wt Readings from Last 3 Encounters:  02/19/22 (!) 330 lb 11 oz (150 kg)     GEN: *** Well nourished, well developed in no acute distress HEENT: Normal NECK: No JVD; No carotid bruits LYMPHATICS: No lymphadenopathy CARDIAC: ***RRR, no murmurs, rubs, gallops RESPIRATORY:  Clear to auscultation without rales, wheezing or rhonchi  ABDOMEN: Soft, non-tender, non-distended MUSCULOSKELETAL:  No edema; No deformity  SKIN: Warm and dry NEUROLOGIC:  Alert and oriented x 3 PSYCHIATRIC:  Normal affect   ASSESSMENT:    No diagnosis found. PLAN:    Calcified mild Mitral Valve Stenosis - will repeat echo, consented for TEE  PAF AFL - CHASDVASC 3 - DOAC - CBC  Morbid Obesity        {Are you ordering a CV Procedure (e.g. stress test, cath, DCCV, TEE, etc)?   Press F2        :[id number]    Medication Adjustments/Labs and Tests Ordered: Current medicines are reviewed at length with the patient today.  Concerns regarding medicines are outlined above.  No orders of the defined types were placed in this encounter.  No orders of the defined types were placed in this encounter.   There are no Patient Instructions on file for this visit.   Signed, Werner Lean, MD  04/26/2022 8:11 AM    [place]

## 2022-05-14 ENCOUNTER — Other Ambulatory Visit: Payer: Self-pay

## 2022-05-14 MED ORDER — APIXABAN 5 MG PO TABS
5.0000 mg | ORAL_TABLET | Freq: Two times a day (BID) | ORAL | 5 refills | Status: AC
Start: 2022-05-14 — End: ?

## 2022-05-14 MED ORDER — APIXABAN 5 MG PO TABS: 5.0000 mg | ORAL_TABLET | Freq: Two times a day (BID) | ORAL | 5 refills | Status: DC

## 2022-05-14 MED ORDER — FUROSEMIDE 20 MG PO TABS: 20.0000 mg | ORAL_TABLET | Freq: Every day | ORAL | Status: DC

## 2022-05-14 MED ORDER — POTASSIUM CHLORIDE CRYS ER 10 MEQ PO TBCR: 10.0000 meq | EXTENDED_RELEASE_TABLET | Freq: Every day | ORAL | Status: DC

## 2022-05-14 MED ORDER — METOPROLOL TARTRATE 50 MG PO TABS: 50.0000 mg | ORAL_TABLET | Freq: Two times a day (BID) | ORAL | Status: DC

## 2022-05-14 NOTE — Telephone Encounter (Signed)
Prescription refill request for Eliquis received. Indication:afib Last office visit:upcoming Scr:0.7 Age: 76 Weight:150 kg  Prescription refilled

## 2022-05-14 NOTE — Telephone Encounter (Signed)
Pt calling requesting refills on her medications until appointment in December 2023 with Dr. Izora Ribas. Would Dr. Izora Ribas like to refill these medications until appointment time? Please address

## 2022-05-19 ENCOUNTER — Telehealth: Payer: Self-pay | Admitting: Internal Medicine

## 2022-05-19 MED ORDER — METOPROLOL TARTRATE 50 MG PO TABS: 50.0000 mg | ORAL_TABLET | Freq: Two times a day (BID) | ORAL | 0 refills | Status: DC

## 2022-05-19 MED ORDER — POTASSIUM CHLORIDE CRYS ER 10 MEQ PO TBCR: 10.0000 meq | EXTENDED_RELEASE_TABLET | Freq: Every day | ORAL | 0 refills | Status: DC

## 2022-05-19 MED ORDER — FUROSEMIDE 20 MG PO TABS: 20.0000 mg | ORAL_TABLET | Freq: Every day | ORAL | 0 refills | Status: DC

## 2022-05-19 NOTE — Telephone Encounter (Signed)
  *  STAT* If patient is at the pharmacy, call can be transferred to refill team.   1. Which medications need to be refilled? (please list name of each medication and dose if known)   furosemide (LASIX) 20 MG tablet Take 1 tablet (20 mg total) by mouth daily.   metoprolol tartrate (LOPRESSOR) 50 MG tablet Take 1 tablet (50 mg total) by mouth 2 (two) times daily.   potassium chloride (KLOR-CON M) 10 MEQ tablet Take 1 tablet (10 mEq total) by mouth daily.     2. Which pharmacy/location (including street and city if local pharmacy) is medication to be sent to? [id number] - [place], [place] - [address]   3. Do they need a 30 day or 90 day supply? 90

## 2022-05-19 NOTE — Telephone Encounter (Signed)
Pt has not been seen in office by Dr. Izora Ribas.  According to DC summary from 02/24/22 pt to f/u with Cardiology in 2-3 weeks. Also, to have f/u labs for Eliquis start.  F/u labs not documented in chart.  Home number on file- number can't be dialed. Work number on file- there is no Gizell here.   Will route to MD for advisement.

## 2022-05-19 NOTE — Telephone Encounter (Signed)
Pt's medications were sent to pt's pharmacy for a 30 day supply until pt is seen in our office by Dr. Izora Ribas. I called the number on file, but it is not a working number. Confirmation that pharmacy received prescriptions.

## 2022-05-19 NOTE — Telephone Encounter (Signed)
Pt is asking for a refill on these medications until appt in December 2023 with Dr. Izora Ribas? Please address

## 2022-05-20 ENCOUNTER — Telehealth: Payer: Self-pay | Admitting: Internal Medicine

## 2022-05-20 NOTE — Telephone Encounter (Signed)
Spoke with pt who reports Eliquis will cost [phone] OOP.  Has used 30 day free trial.  Has 4 pills left.  Advised pt will leave 30 days of samples at the front desk for pick up.  Pt will have Granddaughter Demetrice Bigelow pick up samples.  Also, provided pt with the number for Eliquis patient assistance.  Pt thanked me had no further questions or concerns.

## 2022-05-20 NOTE — Telephone Encounter (Signed)
Pt c/o medication issue:  1. Name of Medication: apixaban (ELIQUIS) 5 MG TABS tablet   2. How are you currently taking this medication (dosage and times per day)?   3. Are you having a reaction (difficulty breathing--STAT)?   4. What is your medication issue? Pt states this medication is $600 and she doesn't know what to do. Pt hasn't been seen in office yet

## 2022-05-20 NOTE — Telephone Encounter (Signed)
Number on file recording says call can't be completed as dialed.  Secondary number is for old place of employment. If pt calls back will need to update contact information.

## 2022-05-20 NOTE — Telephone Encounter (Signed)
Best call back: [phone]   Number updated in demographics

## 2022-05-20 NOTE — Telephone Encounter (Signed)
Called updated number on file does not have a voice mail.  Unable to leave a message.

## 2022-05-20 NOTE — Telephone Encounter (Signed)
Pt is returning call. Transferred to [facility], Charity fundraiser.

## 2022-06-08 ENCOUNTER — Other Ambulatory Visit: Payer: Self-pay | Admitting: Internal Medicine

## 2022-06-09 ENCOUNTER — Telehealth: Payer: Self-pay | Admitting: Internal Medicine

## 2022-06-09 NOTE — Telephone Encounter (Signed)
*  STAT* If patient is at the pharmacy, call can be transferred to refill team.   1. Which medications need to be refilled? (please list name of each medication and dose if known) metoprolol tartrate (LOPRESSOR) 50 MG tablet   2. Which pharmacy/location (including street and city if local pharmacy) is medication to be sent to?  [facility] [phone] - , [place] - [address]    3. Do they need a 30 day or 90 day supply? 90   Patient is out of medication

## 2022-06-17 ENCOUNTER — Encounter: Payer: Self-pay | Admitting: Internal Medicine

## 2022-06-17 ENCOUNTER — Ambulatory Visit: Payer: Medicare Other | Attending: Internal Medicine | Admitting: Internal Medicine

## 2022-06-17 VITALS — BP 140/70 | HR 126 | Ht 65.0 in | Wt 320.0 lb

## 2022-06-17 DIAGNOSIS — I480 Paroxysmal atrial fibrillation: Secondary | ICD-10-CM

## 2022-06-17 MED ORDER — METOPROLOL TARTRATE 100 MG PO TABS: 100.0000 mg | ORAL_TABLET | Freq: Two times a day (BID) | ORAL | 3 refills | Status: DC

## 2022-06-17 NOTE — Progress Notes (Signed)
Cardiology Office Note:    Date:  06/17/2022   ID:  Sandra [name], DOB 1946/02/05, MRN [id number]  PCP:  Patient, No Pcp Per   [facility] Providers Cardiologist:  None     Referring MD: No ref. provider found   CC: AF hospital follow up.  History of Present Illness:    Sandra [name] is a 76 y.o. female with a hx of HTN, and P-AFL.  Complicated by mild to moderate MS (Calcified gradient ~ 6). I believe her valve had calcification not echodensity.  We were not able to to find historic evidence of rheumatic disease and after discussing TEE this was deferred.  She had BC negative this evaluation.  Had 04/26/22 appointment scheduled (no show).  She was having trouble with transport.  Patient notes that she is doing poorly.   In the rehab, she was unable to get transportation. She has chronic lymphedema.  This is unchanged. She has new sudden onset SOB this morning.  Feels like at the nursing home No chest pain. No palpitations.  She can feel her pulse and knows that it is racing. Feels like when I saw here with AFL RVR.   Past Medical History:  Diagnosis Date   Hypertension     Past Surgical History:  Procedure Laterality Date   TONSILLECTOMY      Current Medications: Current Meds  Medication Sig   apixaban (ELIQUIS) 5 MG TABS tablet Take 1 tablet (5 mg total) by mouth 2 (two) times daily.   Emollient (EUCERIN INTENSIVE REPAIR) LOTN Apply topically as needed (dry skin).   furosemide (LASIX) 20 MG tablet Take 1 tablet (20 mg total) by mouth daily.   leptospermum manuka honey (MEDIHONEY) PSTE paste Apply topically as needed (skin irritation).   metoprolol tartrate (LOPRESSOR) 100 MG tablet Take 1 tablet (100 mg total) by mouth 2 (two) times daily. Take 1 tablet (100 mg total) two hours prior to CT scan.   mupirocin cream (BACTROBAN) 2 % Apply 1 Application topically as needed (skin irritation).   potassium chloride (KLOR-CON M) 10 MEQ tablet Take 1 tablet (10  mEq total) by mouth daily.   [DISCONTINUED] metoprolol tartrate (LOPRESSOR) 50 MG tablet Take 1 tablet (50 mg total) by mouth 2 (two) times daily. MUST KEEP UPCOMING APPT     Allergies:   Patient has no known allergies.   Social History   Socioeconomic History   Marital status: Legally Separated    Spouse name: Not on file   Number of children: Not on file   Years of education: Not on file   Highest education level: Not on file  Occupational History   Not on file  Tobacco Use   Smoking status: Never    Passive exposure: Past   Smokeless tobacco: Never  Substance and Sexual Activity   Alcohol use: Not Currently   Drug use: Never   Sexual activity: Not on file  Other Topics Concern   Not on file  Social History Narrative   Not on file   Social Determinants of Health   Financial Resource Strain: Not on file  Food Insecurity: Not on file  Transportation Needs: Not on file  Physical Activity: Not on file  Stress: Not on file  Social Connections: Not on file     Family History: No family history of AF  ROS:   Please see the history of present illness.     All other systems reviewed and are negative.  EKGs/Labs/Other Studies  Reviewed:    The following studies were reviewed today:   EKG:  EKG is  ordered today.  The ekg ordered today demonstrates  06/17/22: AFL RVR 130  Cardiac Studies & Procedures       ECHOCARDIOGRAM  ECHOCARDIOGRAM COMPLETE 02/19/2022  Narrative ECHOCARDIOGRAM REPORT    Patient Name:   Sandra [name] Date of Exam: 02/19/2022 Medical Rec #:  [id number]         Height:       63.0 in Accession #:    [id number]        Weight:       330.7 lb Date of Birth:  Aug 26, 1945         BSA:          2.394 m Patient Age:    39 years          BP:           127/58 mmHg Patient Gender: F                 HR:           92 bpm. Exam Location:  Inpatient  Procedure: 2D Echo, Cardiac Doppler, Color Doppler and Intracardiac Opacification  Agent  Indications:    Congestive Heart Failure I50.9  History:        Patient has no prior history of Echocardiogram examinations. Risk Factors:Hypertension.  Sonographer:    Eartha Inch Referring Phys: [id number] RAVI PAHWANI   Sonographer Comments: Suboptimal subcostal window, suboptimal parasternal window and suboptimal apical window. Image acquisition challenging due to patient body habitus and Image acquisition challenging due to respiratory motion. IMPRESSIONS   1. Left ventricular ejection fraction, by estimation, is 60 to 65%. The left ventricle has normal function. The left ventricle has no regional wall motion abnormalities. There is mild left ventricular hypertrophy. Left ventricular diastolic parameters are consistent with Grade I diastolic dysfunction (impaired relaxation). 2. Right ventricular systolic function is normal. The right ventricular size is normal. 3. Mild-mod MV stenosis, gradient 6.0 mmHg HR 92 bpm. The mitral valve is abnormal. No evidence of mitral valve regurgitation. 4. The aortic valve was not well visualized. Aortic valve regurgitation is not visualized.  Conclusion(s)/Recommendation(s): There's an increase MV gradient with echodensity on the mitral valve. Resolution is challenging with TTE. Recommend TEE.  FINDINGS Left Ventricle: Left ventricular ejection fraction, by estimation, is 60 to 65%. The left ventricle has normal function. The left ventricle has no regional wall motion abnormalities. Definity contrast agent was given IV to delineate the left ventricular endocardial borders. The left ventricular internal cavity size was normal in size. There is mild left ventricular hypertrophy. Left ventricular diastolic parameters are consistent with Grade I diastolic dysfunction (impaired relaxation).  Right Ventricle: The right ventricular size is normal. Right ventricular systolic function is normal.  Left Atrium: Left atrial size was normal in  size.  Right Atrium: Right atrial size was normal in size.  Pericardium: The pericardium was not well visualized.  Mitral Valve: Mild-mod MV stenosis, gradient 6.0 mmHg HR 92 bpm. The mitral valve is abnormal. No evidence of mitral valve regurgitation. MV peak gradient, 10.1 mmHg. The mean mitral valve gradient is 6.0 mmHg.  Tricuspid Valve: Tricuspid valve regurgitation is not demonstrated.  Aortic Valve: The aortic valve was not well visualized. Aortic valve regurgitation is not visualized. Aortic valve mean gradient measures 7.0 mmHg. Aortic valve peak gradient measures 12.2 mmHg. Aortic valve area, by VTI measures 3.01 cm.  Pulmonic Valve: Pulmonic valve  regurgitation is not visualized.  Aorta: The aortic root is normal in size and structure.   LEFT VENTRICLE PLAX 2D LVIDd:         3.80 cm   Diastology LVIDs:         2.70 cm   LV e' medial:    7.72 cm/s LV PW:         1.10 cm   LV E/e' medial:  18.5 LV IVS:        1.10 cm   LV e' lateral:   5.98 cm/s LVOT diam:     2.00 cm   LV E/e' lateral: 23.9 LV SV:         93 LV SV Index:   39 LVOT Area:     3.14 cm   RIGHT VENTRICLE RV S prime:     13.30 cm/s TAPSE (M-mode): 1.8 cm  LEFT ATRIUM             Index        RIGHT ATRIUM          Index LA diam:        3.80 cm 1.59 cm/m   RA Area:     9.35 cm LA Vol (A2C):   53.4 ml 22.30 ml/m  RA Volume:   16.60 ml 6.93 ml/m LA Vol (A4C):   49.9 ml 20.84 ml/m LA Biplane Vol: 52.0 ml 21.72 ml/m AORTIC VALVE AV Area (Vmax):    2.76 cm AV Area (Vmean):   2.71 cm AV Area (VTI):     3.01 cm AV Vmax:           175.00 cm/s AV Vmean:          130.000 cm/s AV VTI:            0.309 m AV Peak Grad:      12.2 mmHg AV Mean Grad:      7.0 mmHg LVOT Vmax:         154.00 cm/s LVOT Vmean:        112.000 cm/s LVOT VTI:          0.296 m LVOT/AV VTI ratio: 0.96  AORTA Ao Root diam: 2.90 cm  MITRAL VALVE MV Area (PHT): 2.07 cm     SHUNTS MV Area VTI:   2.61 cm     Systemic VTI:   0.30 m MV Peak grad:  10.1 mmHg    Systemic Diam: 2.00 cm MV Mean grad:  6.0 mmHg MV Vmax:       1.59 m/s MV Vmean:      121.0 cm/s MV Decel Time: 366 msec MV E velocity: 143.00 cm/s MV A velocity: 158.00 cm/s MV E/A ratio:  0.91  Carolan Clines Electronically signed by Carolan Clines Signature Date/Time: 02/19/2022/4:02:43 PM    Final              Recent Labs: 02/18/2022: ALT 29; B Natriuretic Peptide 62.9 02/23/2022: TSH 1.852 02/24/2022: BUN 12; Creatinine, Ser 0.77; Hemoglobin 13.0; Magnesium 2.1; Platelets 271; Potassium 3.9; Sodium 139  Recent Lipid Panel    Component Value Date/Time   CHOL 220 (H) 02/15/2008 2050   TRIG 128 02/15/2008 2050   HDL 41 02/15/2008 2050   CHOLHDL 5.4 Ratio 02/15/2008 2050   VLDL 26 02/15/2008 2050   LDLCALC 153 (H) 02/15/2008 2050     Risk Assessment/Calculations:    CHA2DS2-VASc Score = 3   This indicates a 3.2% annual risk of stroke. The patient's score is based  upon: CHF History: 0 HTN History: 1 Diabetes History: 0 Stroke History: 0 Vascular Disease History: 0 Age Score: 1 Gender Score: 1       Physical Exam:    VS:  BP (!) 140/70   Pulse (!) 126   Ht 5\' 5"  (1.651 m)   Wt (!) 320 lb (145.2 kg)   SpO2 97%   BMI 53.25 kg/m     Wt Readings from Last 3 Encounters:  06/17/22 (!) 320 lb (145.2 kg)  02/19/22 (!) 330 lb 11 oz (150 kg)    GEN: Morbid obesity no distress HEENT: Normal NECK: No JVD; No carotid bruits LYMPHATICS: No lymphadenopathy CARDIAC: Regular tachycardia with diastolic murmur no rubs, gallops RESPIRATORY:  Clear to auscultation without rales, wheezing or rhonchi  ABDOMEN: Soft, non-tender, non-distended MUSCULOSKELETAL:  No edema; No deformity  SKIN: Warm and dry NEUROLOGIC:  Alert and oriented x 3 PSYCHIATRIC:  Normal affect   ASSESSMENT:    1. Paroxysmal atrial fibrillation (HCC)    PLAN:    Morbid obesity Mitral Stenosis Mild Paroxysmal Atrial Flutter with a rapid ventricular  - I would  like to better characterize the mitral valve (she has missed no doses of her AC, but I would like to confirm no rheumatic disease and 3D planimeter her valve)  - Continue anticoagulation with eliquis; Acquired Thrombophilia - Discussed indications for ED Eval - will trial increase in metoprolol and TEE/DCCV next available - consented for TEE/DCCV - increase BB - if getting worse would need ED eval for DCCV and we would do TEE at a later date  A fib clinic post DCCV       Medication Adjustments/Labs and Tests Ordered: Current medicines are reviewed at length with the patient today.  Concerns regarding medicines are outlined above.  Orders Placed This Encounter  Procedures   CBC   Basic metabolic panel   EKG [id number]   Meds ordered this encounter  Medications   metoprolol tartrate (LOPRESSOR) 100 MG tablet    Sig: Take 1 tablet (100 mg total) by mouth 2 (two) times daily. Take 1 tablet (100 mg total) two hours prior to CT scan.    Dispense:  180 tablet    Refill:  3    Patient Instructions  Medication Instructions:  Your physician recommends that you continue on your current medications as directed. Please refer to the Current Medication list given to you today.  *If you need a refill on your cardiac medications before your next appointment, please call your pharmacy*   Lab Work: CBC, BMET  If you have labs (blood work) drawn today and your tests are completely normal, you will receive your results only by: [place] (if you have MyChart) OR A paper copy in the mail If you have any lab test that is abnormal or we need to change your treatment, we will call you to review the results.   Testing/Procedures: Your physician has recommended that you have a Cardioversion (DCCV). Electrical Cardioversion uses a jolt of electricity to your heart either through paddles or wired patches attached to your chest. This is a controlled, usually prescheduled, procedure. Defibrillation  is done under light anesthesia in the hospital, and you usually go home the day of the procedure. This is done to get your heart back into a normal rhythm. You are not awake for the procedure. Please see the instruction sheet given to you today.    Follow-Up: At [facility], you and your health needs are  our priority.  As part of our continuing mission to provide you with exceptional heart care, we have created designated Provider Care Teams.  These Care Teams include your primary Cardiologist (physician) and Advanced Practice Providers (APPs -  Physician Assistants and Nurse Practitioners) who all work together to provide you with the care you need, when you need it.  We recommend signing up for the patient portal called "MyChart".  Sign up information is provided on this After Visit Summary.  MyChart is used to connect with patients for Virtual Visits (Telemedicine).  Patients are able to view lab/test results, encounter notes, upcoming appointments, etc.  Non-urgent messages can be sent to your provider as well.   To learn more about what you can do with MyChart, go to [email or web address].    Your next appointment:   After cardioversion on Wednesday 06/23/2022  The format for your next appointment:   In Person  Provider:   [facility]   Other Instructions     Dear Sandra [name]  You are scheduled for a TEE (Transesophageal Echocardiogram) and Cardioversion on Wednesday, December 20 with Dr. Johnsie Cancel.  Please arrive at the [facility] (Main Entrance A) at [facility]: [address] [place], [place] [address] at 11:30 AM.   DIET:  Nothing to eat or drink after midnight except a sip of water with medications (see medication instructions below)  MEDICATION INSTRUCTIONS:  Continue taking your anticoagulant (blood thinner): Apixaban (Eliquis).  You will need to continue this after your procedure until you are told by your provider that it is safe to stop.      FYI:  For your safety, and to allow [place] to monitor your vital signs accurately during the surgery/procedure we request: If you have artificial nails, gel coating, SNS etc, please have those removed prior to your surgery/procedure. Not having the nail coverings /polish removed may result in cancellation or delay of your surgery/procedure.  You must have a responsible person to drive you home and stay in the waiting area during your procedure. Failure to do so could result in cancellation.  Bring your insurance cards.  *Special Note: Every effort is made to have your procedure done on time. Occasionally there are emergencies that occur at the hospital that may cause delays. Please be patient if a delay does occur.            Signed, Werner Lean, MD  06/17/2022 5:29 PM    [place]

## 2022-06-17 NOTE — H&P (View-Only) (Signed)
Cardiology Office Note:    Date:  06/17/2022   ID:  Sandra [name], DOB 08/16/1945, MRN [id number]  PCP:  Patient, No Pcp Per   [facility] Providers Cardiologist:  None     Referring MD: No ref. provider found   CC: AF hospital follow up.  History of Present Illness:    Sandra [name] is a 76 y.o. female with a hx of HTN, and P-AFL.  Complicated by mild to moderate MS (Calcified gradient ~ 6). I believe her valve had calcification not echodensity.  We were not able to to find historic evidence of rheumatic disease and after discussing TEE this was deferred.  She had BC negative this evaluation.  Had 04/26/22 appointment scheduled (no show).  She was having trouble with transport.  Patient notes that she is doing poorly.   In the rehab, she was unable to get transportation. She has chronic lymphedema.  This is unchanged. She has new sudden onset SOB this morning.  Feels like at the nursing home No chest pain. No palpitations.  She can feel her pulse and knows that it is racing. Feels like when I saw here with AFL RVR.   Past Medical History:  Diagnosis Date   Hypertension     Past Surgical History:  Procedure Laterality Date   TONSILLECTOMY      Current Medications: Current Meds  Medication Sig   apixaban (ELIQUIS) 5 MG TABS tablet Take 1 tablet (5 mg total) by mouth 2 (two) times daily.   Emollient (EUCERIN INTENSIVE REPAIR) LOTN Apply topically as needed (dry skin).   furosemide (LASIX) 20 MG tablet Take 1 tablet (20 mg total) by mouth daily.   leptospermum manuka honey (MEDIHONEY) PSTE paste Apply topically as needed (skin irritation).   metoprolol tartrate (LOPRESSOR) 100 MG tablet Take 1 tablet (100 mg total) by mouth 2 (two) times daily. Take 1 tablet (100 mg total) two hours prior to CT scan.   mupirocin cream (BACTROBAN) 2 % Apply 1 Application topically as needed (skin irritation).   potassium chloride (KLOR-CON M) 10 MEQ tablet Take 1 tablet (10  mEq total) by mouth daily.   [DISCONTINUED] metoprolol tartrate (LOPRESSOR) 50 MG tablet Take 1 tablet (50 mg total) by mouth 2 (two) times daily. MUST KEEP UPCOMING APPT     Allergies:   Patient has no known allergies.   Social History   Socioeconomic History   Marital status: Legally Separated    Spouse name: Not on file   Number of children: Not on file   Years of education: Not on file   Highest education level: Not on file  Occupational History   Not on file  Tobacco Use   Smoking status: Never    Passive exposure: Past   Smokeless tobacco: Never  Substance and Sexual Activity   Alcohol use: Not Currently   Drug use: Never   Sexual activity: Not on file  Other Topics Concern   Not on file  Social History Narrative   Not on file   Social Determinants of Health   Financial Resource Strain: Not on file  Food Insecurity: Not on file  Transportation Needs: Not on file  Physical Activity: Not on file  Stress: Not on file  Social Connections: Not on file     Family History: No family history of AF  ROS:   Please see the history of present illness.     All other systems reviewed and are negative.  EKGs/Labs/Other Studies   Reviewed:    The following studies were reviewed today:   EKG:  EKG is  ordered today.  The ekg ordered today demonstrates  06/17/22: AFL RVR 130  Cardiac Studies & Procedures       ECHOCARDIOGRAM  ECHOCARDIOGRAM COMPLETE 02/19/2022  Narrative ECHOCARDIOGRAM REPORT    Patient Name:   Sandra [name] Date of Exam: 02/19/2022 Medical Rec #:  [id number]         Height:       63.0 in Accession #:    [id number]        Weight:       330.7 lb Date of Birth:  1946-07-04         BSA:          2.394 m Patient Age:    74 years          BP:           127/58 mmHg Patient Gender: F                 HR:           92 bpm. Exam Location:  Inpatient  Procedure: 2D Echo, Cardiac Doppler, Color Doppler and Intracardiac Opacification  Agent  Indications:    Congestive Heart Failure I50.9  History:        Patient has no prior history of Echocardiogram examinations. Risk Factors:Hypertension.  Sonographer:    Eartha Inch Referring Phys: [id number] RAVI PAHWANI   Sonographer Comments: Suboptimal subcostal window, suboptimal parasternal window and suboptimal apical window. Image acquisition challenging due to patient body habitus and Image acquisition challenging due to respiratory motion. IMPRESSIONS   1. Left ventricular ejection fraction, by estimation, is 60 to 65%. The left ventricle has normal function. The left ventricle has no regional wall motion abnormalities. There is mild left ventricular hypertrophy. Left ventricular diastolic parameters are consistent with Grade I diastolic dysfunction (impaired relaxation). 2. Right ventricular systolic function is normal. The right ventricular size is normal. 3. Mild-mod MV stenosis, gradient 6.0 mmHg HR 92 bpm. The mitral valve is abnormal. No evidence of mitral valve regurgitation. 4. The aortic valve was not well visualized. Aortic valve regurgitation is not visualized.  Conclusion(s)/Recommendation(s): There's an increase MV gradient with echodensity on the mitral valve. Resolution is challenging with TTE. Recommend TEE.  FINDINGS Left Ventricle: Left ventricular ejection fraction, by estimation, is 60 to 65%. The left ventricle has normal function. The left ventricle has no regional wall motion abnormalities. Definity contrast agent was given IV to delineate the left ventricular endocardial borders. The left ventricular internal cavity size was normal in size. There is mild left ventricular hypertrophy. Left ventricular diastolic parameters are consistent with Grade I diastolic dysfunction (impaired relaxation).  Right Ventricle: The right ventricular size is normal. Right ventricular systolic function is normal.  Left Atrium: Left atrial size was normal in  size.  Right Atrium: Right atrial size was normal in size.  Pericardium: The pericardium was not well visualized.  Mitral Valve: Mild-mod MV stenosis, gradient 6.0 mmHg HR 92 bpm. The mitral valve is abnormal. No evidence of mitral valve regurgitation. MV peak gradient, 10.1 mmHg. The mean mitral valve gradient is 6.0 mmHg.  Tricuspid Valve: Tricuspid valve regurgitation is not demonstrated.  Aortic Valve: The aortic valve was not well visualized. Aortic valve regurgitation is not visualized. Aortic valve mean gradient measures 7.0 mmHg. Aortic valve peak gradient measures 12.2 mmHg. Aortic valve area, by VTI measures 3.01 cm.  Pulmonic Valve: Pulmonic valve  regurgitation is not visualized.  Aorta: The aortic root is normal in size and structure.   LEFT VENTRICLE PLAX 2D LVIDd:         3.80 cm   Diastology LVIDs:         2.70 cm   LV e' medial:    7.72 cm/s LV PW:         1.10 cm   LV E/e' medial:  18.5 LV IVS:        1.10 cm   LV e' lateral:   5.98 cm/s LVOT diam:     2.00 cm   LV E/e' lateral: 23.9 LV SV:         93 LV SV Index:   39 LVOT Area:     3.14 cm   RIGHT VENTRICLE RV S prime:     13.30 cm/s TAPSE (M-mode): 1.8 cm  LEFT ATRIUM             Index        RIGHT ATRIUM          Index LA diam:        3.80 cm 1.59 cm/m   RA Area:     9.35 cm LA Vol (A2C):   53.4 ml 22.30 ml/m  RA Volume:   16.60 ml 6.93 ml/m LA Vol (A4C):   49.9 ml 20.84 ml/m LA Biplane Vol: 52.0 ml 21.72 ml/m AORTIC VALVE AV Area (Vmax):    2.76 cm AV Area (Vmean):   2.71 cm AV Area (VTI):     3.01 cm AV Vmax:           175.00 cm/s AV Vmean:          130.000 cm/s AV VTI:            0.309 m AV Peak Grad:      12.2 mmHg AV Mean Grad:      7.0 mmHg LVOT Vmax:         154.00 cm/s LVOT Vmean:        112.000 cm/s LVOT VTI:          0.296 m LVOT/AV VTI ratio: 0.96  AORTA Ao Root diam: 2.90 cm  MITRAL VALVE MV Area (PHT): 2.07 cm     SHUNTS MV Area VTI:   2.61 cm     Systemic VTI:   0.30 m MV Peak grad:  10.1 mmHg    Systemic Diam: 2.00 cm MV Mean grad:  6.0 mmHg MV Vmax:       1.59 m/s MV Vmean:      121.0 cm/s MV Decel Time: 366 msec MV E velocity: 143.00 cm/s MV A velocity: 158.00 cm/s MV E/A ratio:  0.91  Carolan Clines Electronically signed by Carolan Clines Signature Date/Time: 02/19/2022/4:02:43 PM    Final              Recent Labs: 02/18/2022: ALT 29; B Natriuretic Peptide 62.9 02/23/2022: TSH 1.852 02/24/2022: BUN 12; Creatinine, Ser 0.77; Hemoglobin 13.0; Magnesium 2.1; Platelets 271; Potassium 3.9; Sodium 139  Recent Lipid Panel    Component Value Date/Time   CHOL 220 (H) 02/15/2008 2050   TRIG 128 02/15/2008 2050   HDL 41 02/15/2008 2050   CHOLHDL 5.4 Ratio 02/15/2008 2050   VLDL 26 02/15/2008 2050   LDLCALC 153 (H) 02/15/2008 2050     Risk Assessment/Calculations:    CHA2DS2-VASc Score = 3   This indicates a 3.2% annual risk of stroke. The patient's score is based  upon: CHF History: 0 HTN History: 1 Diabetes History: 0 Stroke History: 0 Vascular Disease History: 0 Age Score: 1 Gender Score: 1       Physical Exam:    VS:  BP (!) 140/70   Pulse (!) 126   Ht 5\' 5"  (1.651 m)   Wt (!) 320 lb (145.2 kg)   SpO2 97%   BMI 53.25 kg/m     Wt Readings from Last 3 Encounters:  06/17/22 (!) 320 lb (145.2 kg)  02/19/22 (!) 330 lb 11 oz (150 kg)    GEN: Morbid obesity no distress HEENT: Normal NECK: No JVD; No carotid bruits LYMPHATICS: No lymphadenopathy CARDIAC: Regular tachycardia with diastolic murmur no rubs, gallops RESPIRATORY:  Clear to auscultation without rales, wheezing or rhonchi  ABDOMEN: Soft, non-tender, non-distended MUSCULOSKELETAL:  No edema; No deformity  SKIN: Warm and dry NEUROLOGIC:  Alert and oriented x 3 PSYCHIATRIC:  Normal affect   ASSESSMENT:    1. Paroxysmal atrial fibrillation (HCC)    PLAN:    Morbid obesity Mitral Stenosis Mild Paroxysmal Atrial Flutter with a rapid ventricular  - I would  like to better characterize the mitral valve (she has missed no doses of her AC, but I would like to confirm no rheumatic disease and 3D planimeter her valve)  - Continue anticoagulation with eliquis; Acquired Thrombophilia - Discussed indications for ED Eval - will trial increase in metoprolol and TEE/DCCV next available - consented for TEE/DCCV - increase BB - if getting worse would need ED eval for DCCV and we would do TEE at a later date  A fib clinic post DCCV       Medication Adjustments/Labs and Tests Ordered: Current medicines are reviewed at length with the patient today.  Concerns regarding medicines are outlined above.  Orders Placed This Encounter  Procedures   CBC   Basic metabolic panel   EKG [id number]   Meds ordered this encounter  Medications   metoprolol tartrate (LOPRESSOR) 100 MG tablet    Sig: Take 1 tablet (100 mg total) by mouth 2 (two) times daily. Take 1 tablet (100 mg total) two hours prior to CT scan.    Dispense:  180 tablet    Refill:  3    Patient Instructions  Medication Instructions:  Your physician recommends that you continue on your current medications as directed. Please refer to the Current Medication list given to you today.  *If you need a refill on your cardiac medications before your next appointment, please call your pharmacy*   Lab Work: CBC, BMET  If you have labs (blood work) drawn today and your tests are completely normal, you will receive your results only by: [place] (if you have MyChart) OR A paper copy in the mail If you have any lab test that is abnormal or we need to change your treatment, we will call you to review the results.   Testing/Procedures: Your physician has recommended that you have a Cardioversion (DCCV). Electrical Cardioversion uses a jolt of electricity to your heart either through paddles or wired patches attached to your chest. This is a controlled, usually prescheduled, procedure. Defibrillation  is done under light anesthesia in the hospital, and you usually go home the day of the procedure. This is done to get your heart back into a normal rhythm. You are not awake for the procedure. Please see the instruction sheet given to you today.    Follow-Up: At [facility], you and your health needs are  our priority.  As part of our continuing mission to provide you with exceptional heart care, we have created designated Provider Care Teams.  These Care Teams include your primary Cardiologist (physician) and Advanced Practice Providers (APPs -  Physician Assistants and Nurse Practitioners) who all work together to provide you with the care you need, when you need it.  We recommend signing up for the patient portal called "MyChart".  Sign up information is provided on this After Visit Summary.  MyChart is used to connect with patients for Virtual Visits (Telemedicine).  Patients are able to view lab/test results, encounter notes, upcoming appointments, etc.  Non-urgent messages can be sent to your provider as well.   To learn more about what you can do with MyChart, go to [email or web address].    Your next appointment:   After cardioversion on Wednesday 06/23/2022  The format for your next appointment:   In Person  Provider:   Afib Clinic   Other Instructions     Dear Sandra [name]  You are scheduled for a TEE (Transesophageal Echocardiogram) and Cardioversion on Wednesday, December 20 with Dr. Johnsie Cancel.  Please arrive at the [facility] (Main Entrance A) at [facility]: [address] [place], [place] [address] at 11:30 AM.   DIET:  Nothing to eat or drink after midnight except a sip of water with medications (see medication instructions below)  MEDICATION INSTRUCTIONS:  Continue taking your anticoagulant (blood thinner): Apixaban (Eliquis).  You will need to continue this after your procedure until you are told by your provider that it is safe to stop.      FYI:  For your safety, and to allow [place] to monitor your vital signs accurately during the surgery/procedure we request: If you have artificial nails, gel coating, SNS etc, please have those removed prior to your surgery/procedure. Not having the nail coverings /polish removed may result in cancellation or delay of your surgery/procedure.  You must have a responsible person to drive you home and stay in the waiting area during your procedure. Failure to do so could result in cancellation.  Bring your insurance cards.  *Special Note: Every effort is made to have your procedure done on time. Occasionally there are emergencies that occur at the hospital that may cause delays. Please be patient if a delay does occur.            Signed, Werner Lean, MD  06/17/2022 5:29 PM    [place]

## 2022-06-17 NOTE — Patient Instructions (Signed)
Medication Instructions:  Your physician recommends that you continue on your current medications as directed. Please refer to the Current Medication list given to you today.  *If you need a refill on your cardiac medications before your next appointment, please call your pharmacy*   Lab Work: CBC, BMET  If you have labs (blood work) drawn today and your tests are completely normal, you will receive your results only by: MyChart Message (if you have MyChart) OR A paper copy in the mail If you have any lab test that is abnormal or we need to change your treatment, we will call you to review the results.   Testing/Procedures: Your physician has recommended that you have a Cardioversion (DCCV). Electrical Cardioversion uses a jolt of electricity to your heart either through paddles or wired patches attached to your chest. This is a controlled, usually prescheduled, procedure. Defibrillation is done under light anesthesia in the hospital, and you usually go home the day of the procedure. This is done to get your heart back into a normal rhythm. You are not awake for the procedure. Please see the instruction sheet given to you today.    Follow-Up: At [facility], you and your health needs are our priority.  As part of our continuing mission to provide you with exceptional heart care, we have created designated Provider Care Teams.  These Care Teams include your primary Cardiologist (physician) and Advanced Practice Providers (APPs -  Physician Assistants and Nurse Practitioners) who all work together to provide you with the care you need, when you need it.  We recommend signing up for the patient portal called "MyChart".  Sign up information is provided on this After Visit Summary.  MyChart is used to connect with patients for Virtual Visits (Telemedicine).  Patients are able to view lab/test results, encounter notes, upcoming appointments, etc.  Non-urgent messages can be sent to your  provider as well.   To learn more about what you can do with MyChart, go to [email or web address].    Your next appointment:   After cardioversion on Wednesday 06/23/2022  The format for your next appointment:   In Person  Provider:   Afib Clinic   Other Instructions     Dear Sandra [name]  You are scheduled for a TEE (Transesophageal Echocardiogram) and Cardioversion on Wednesday, December 20 with Dr. Eden Emms.  Please arrive at the [facility] (Main Entrance A) at [facility]: [address] [place], [place] [address] at 11:30 AM.   DIET:  Nothing to eat or drink after midnight except a sip of water with medications (see medication instructions below)  MEDICATION INSTRUCTIONS:  Continue taking your anticoagulant (blood thinner): Apixaban (Eliquis).  You will need to continue this after your procedure until you are told by your provider that it is safe to stop.     FYI:  For your safety, and to allow [place] to monitor your vital signs accurately during the surgery/procedure we request: If you have artificial nails, gel coating, SNS etc, please have those removed prior to your surgery/procedure. Not having the nail coverings /polish removed may result in cancellation or delay of your surgery/procedure.  You must have a responsible person to drive you home and stay in the waiting area during your procedure. Failure to do so could result in cancellation.  Bring your insurance cards.  *Special Note: Every effort is made to have your procedure done on time. Occasionally there are emergencies that occur at the hospital that may  cause delays. Please be patient if a delay does occur.

## 2022-06-18 ENCOUNTER — Other Ambulatory Visit: Payer: Self-pay | Admitting: Internal Medicine

## 2022-06-18 LAB — CBC
Hematocrit: 35.1 % (ref 34.0–46.6)
Hemoglobin: 11.8 g/dL (ref 11.1–15.9)
MCH: 28.6 pg (ref 26.6–33.0)
MCHC: 33.6 g/dL (ref 31.5–35.7)
MCV: 85 fL (ref 79–97)
Platelets: 211 10*3/uL (ref 150–450)
RBC: 4.13 x10E6/uL (ref 3.77–5.28)
RDW: 14.2 % (ref 11.7–15.4)
WBC: 9.9 10*3/uL (ref 3.4–10.8)

## 2022-06-18 LAB — BASIC METABOLIC PANEL
BUN/Creatinine Ratio: 16 (ref 12–28)
BUN: 12 mg/dL (ref 8–27)
CO2: 26 mmol/L (ref 20–29)
Calcium: 8.9 mg/dL (ref 8.7–10.3)
Chloride: 105 mmol/L (ref 96–106)
Creatinine, Ser: 0.74 mg/dL (ref 0.57–1.00)
Glucose: 102 mg/dL — ABNORMAL HIGH (ref 70–99)
Potassium: 4 mmol/L (ref 3.5–5.2)
Sodium: 141 mmol/L (ref 134–144)
eGFR: 85 mL/min/{1.73_m2} (ref >59)

## 2022-06-21 ENCOUNTER — Other Ambulatory Visit: Payer: Self-pay | Admitting: Internal Medicine

## 2022-06-22 ENCOUNTER — Telehealth: Payer: Self-pay | Admitting: Internal Medicine

## 2022-06-22 NOTE — Telephone Encounter (Signed)
Called pt reviewed TEE/DCCV instructions.  Advised pt not to take furosemide the morning of procedure.  Pt concerned that instructions for metoprolol tartrate 100 mg say to take 2 hours prior to Cardiac CT.  Advised pt will send message to MD for clarification if pt is to have a Cardiac CT.  No order is in Epic.

## 2022-06-22 NOTE — Telephone Encounter (Signed)
New Message:      Please call, questions about her procedure tomorrow morning.

## 2022-06-23 ENCOUNTER — Ambulatory Visit (HOSPITAL_COMMUNITY): Payer: Medicare Other | Admitting: Anesthesiology

## 2022-06-23 ENCOUNTER — Ambulatory Visit (HOSPITAL_COMMUNITY)
Admission: RE | Admit: 2022-06-23 | Discharge: 2022-06-23 | Disposition: A | Discharge status: Home / Self Care | Payer: Medicare Other | Attending: Cardiovascular Disease | Admitting: Cardiovascular Disease

## 2022-06-23 ENCOUNTER — Encounter (HOSPITAL_COMMUNITY): Payer: Self-pay | Admitting: Cardiovascular Disease

## 2022-06-23 ENCOUNTER — Encounter (HOSPITAL_COMMUNITY)
Admission: RE | Disposition: A | Discharge status: Home / Self Care | Payer: Self-pay | Source: Home / Self Care | Attending: Cardiovascular Disease

## 2022-06-23 ENCOUNTER — Ambulatory Visit (HOSPITAL_COMMUNITY): Admission: RE | Admit: 2022-06-23 | Payer: Medicare Other | Source: Ambulatory Visit | Admitting: Cardiovascular Disease

## 2022-06-23 DIAGNOSIS — I050 Rheumatic mitral stenosis: Secondary | ICD-10-CM | POA: Diagnosis not present

## 2022-06-23 DIAGNOSIS — D6859 Other primary thrombophilia: Secondary | ICD-10-CM | POA: Diagnosis not present

## 2022-06-23 DIAGNOSIS — Z6841 Body Mass Index (BMI) 40.0 and over, adult: Secondary | ICD-10-CM | POA: Insufficient documentation

## 2022-06-23 DIAGNOSIS — Z539 Procedure and treatment not carried out, unspecified reason: Secondary | ICD-10-CM | POA: Insufficient documentation

## 2022-06-23 DIAGNOSIS — I110 Hypertensive heart disease with heart failure: Secondary | ICD-10-CM | POA: Diagnosis not present

## 2022-06-23 DIAGNOSIS — I509 Heart failure, unspecified: Secondary | ICD-10-CM | POA: Insufficient documentation

## 2022-06-23 DIAGNOSIS — I4892 Unspecified atrial flutter: Secondary | ICD-10-CM | POA: Diagnosis not present

## 2022-06-23 DIAGNOSIS — I480 Paroxysmal atrial fibrillation: Secondary | ICD-10-CM | POA: Diagnosis present

## 2022-06-23 DIAGNOSIS — Z79899 Other long term (current) drug therapy: Secondary | ICD-10-CM | POA: Diagnosis not present

## 2022-06-23 DIAGNOSIS — Z7901 Long term (current) use of anticoagulants: Secondary | ICD-10-CM | POA: Insufficient documentation

## 2022-06-23 DIAGNOSIS — I890 Lymphedema, not elsewhere classified: Secondary | ICD-10-CM | POA: Diagnosis not present

## 2022-06-23 SURGERY — CANCELLED PROCEDURE
Anesthesia: Monitor Anesthesia Care

## 2022-06-23 MED ORDER — LACTATED RINGERS IV SOLN
INTRAVENOUS | Status: DC | PRN
  Administered 2022-06-23: 1000 mL via INTRAVENOUS

## 2022-06-23 MED ORDER — METOPROLOL TARTRATE 100 MG PO TABS: 100.0000 mg | ORAL_TABLET | Freq: Two times a day (BID) | ORAL | 3 refills | Status: DC

## 2022-06-23 MED ORDER — SODIUM CHLORIDE 0.9 % IV SOLN: INTRAVENOUS | Status: DC

## 2022-06-23 NOTE — Interval H&P Note (Signed)
History and Physical Interval Note:  06/23/2022 11:56 AM  Sandra [name]  has presented today for surgery, with the diagnosis of rapid atrial fibrillation with RVR.  The various methods of treatment have been discussed with the patient and family. After consideration of risks, benefits and other options for treatment, the patient has consented to  Procedure(s): TRANSESOPHAGEAL ECHOCARDIOGRAM (TEE) (N/A) CARDIOVERSION (N/A) as a surgical intervention.  The patient's history has been reviewed, patient examined, no change in status, stable for surgery.  I have reviewed the patient's chart and labs.  Questions were answered to the patient's satisfaction.     Charlton Haws

## 2022-06-23 NOTE — Progress Notes (Signed)
Pt in NSR confirmation with EKG. Procedure for TEE/CV cancelled per Dr. Eden Emms

## 2022-06-23 NOTE — H&P (Signed)
Patient arrived in endo for TEE/DCC She was in NSR at a rate of 68 bpm Discussed with primary cardiologist and No need to assess MV/MAC by TEE Will f/u in afib clinic and TTE as outpatient She was started on metoprolol 100 mg on 06/17/22 And likely helped with conversion Continue eliquis  Jenkins Rouge MD [facility]

## 2022-06-23 NOTE — Telephone Encounter (Signed)
Pt had OV with Dr. Izora Ribas on 06/17/22.  Potassium 10 mEq PO QD and furosemide 20 mg PO QD sent to pharmacy.

## 2022-06-23 NOTE — Telephone Encounter (Signed)
Updated metoprolol tartrate to 100 mg PO BID.

## 2022-06-23 NOTE — Anesthesia Preprocedure Evaluation (Deleted)
Anesthesia Evaluation  Patient identified by MRN, date of birth, ID band Patient awake    Reviewed: Allergy & Precautions, NPO status , Patient's Chart, lab work & pertinent test results, reviewed documented beta blocker date and time   History of Anesthesia Complications Negative for: history of anesthetic complications  Airway Mallampati: II  TM Distance: >3 FB Neck ROM: Full    Dental  (+) Dental Advisory Given, Loose, Poor Dentition,    Pulmonary neg pulmonary ROS   Pulmonary exam normal        Cardiovascular hypertension, Pt. on medications and Pt. on home beta blockers Normal cardiovascular exam+ dysrhythmias Atrial Fibrillation + Valvular Problems/Murmurs (MS)    '23 TTE - EF 60 to 65%. There is mild left ventricular hypertrophy. Grade I diastolic dysfunction (impaired relaxation). Mild-mod MV stenosis, gradient 6.0 mmHg HR 92 bpm.     Neuro/Psych negative neurological ROS  negative psych ROS   GI/Hepatic negative GI ROS, Neg liver ROS,,,  Endo/Other    Morbid obesity  Renal/GU negative Renal ROS     Musculoskeletal negative musculoskeletal ROS (+)    Abdominal   Peds  Hematology  On eliquis    Anesthesia Other Findings   Reproductive/Obstetrics                             Anesthesia Physical Anesthesia Plan  ASA: 3  Anesthesia Plan: MAC   Post-op Pain Management:    Induction: Intravenous  PONV Risk Score and Plan: 3 and Treatment may vary due to age or medical condition and Propofol infusion  Airway Management Planned: Natural Airway and Mask  Additional Equipment: None  Intra-op Plan:   Post-operative Plan:   Informed Consent: I have reviewed the patients History and Physical, chart, labs and discussed the procedure including the risks, benefits and alternatives for the proposed anesthesia with the patient or authorized representative who has indicated his/her  understanding and acceptance.       Plan Discussed with: CRNA and Anesthesiologist  Anesthesia Plan Comments: (Patient in SR in preop. No cardioversion. Cardiologist determining necessity of TEE )       Anesthesia Quick Evaluation

## 2022-07-08 ENCOUNTER — Ambulatory Visit (HOSPITAL_COMMUNITY): Payer: Medicare Other | Admitting: Physician Assistant

## 2022-07-15 ENCOUNTER — Ambulatory Visit (HOSPITAL_COMMUNITY): Payer: Medicare Other | Admitting: Physician Assistant

## 2022-07-22 ENCOUNTER — Other Ambulatory Visit: Payer: Self-pay | Admitting: Internal Medicine

## 2022-07-23 LAB — CBC
HCT: 36.1 % (ref 35.0–45.0)
Hemoglobin: 12 g/dL (ref 11.7–15.5)
MCH: 28.2 pg (ref 27.0–33.0)
MCHC: 33.2 g/dL (ref 32.0–36.0)
MCV: 84.9 fL (ref 80.0–100.0)
MPV: 13.3 fL — ABNORMAL HIGH (ref 7.5–12.5)
Platelets: 208 10*3/uL (ref 140–400)
RBC: 4.25 10*6/uL (ref 3.80–5.10)
RDW: 14.1 % (ref 11.0–15.0)
WBC: 9.5 10*3/uL (ref 3.8–10.8)

## 2022-07-23 LAB — COMPLETE METABOLIC PANEL WITH GFR
AG Ratio: 1.1 (calc) (ref 1.0–2.5)
ALT: 11 U/L (ref 6–29)
AST: 17 U/L (ref 10–35)
Albumin: 3.8 g/dL (ref 3.6–5.1)
Alkaline phosphatase (APISO): 67 U/L (ref 37–153)
BUN: 14 mg/dL (ref 7–25)
CO2: 21 mmol/L (ref 20–32)
Calcium: 9.5 mg/dL (ref 8.6–10.4)
Chloride: 112 mmol/L — ABNORMAL HIGH (ref 98–110)
Creat: 0.96 mg/dL (ref 0.60–1.00)
Globulin: 3.4 g/dL (calc) (ref 1.9–3.7)
Glucose, Bld: 96 mg/dL (ref 65–99)
Potassium: 4.2 mmol/L (ref 3.5–5.3)
Sodium: 150 mmol/L — ABNORMAL HIGH (ref 135–146)
Total Bilirubin: 0.4 mg/dL (ref 0.2–1.2)
Total Protein: 7.2 g/dL (ref 6.1–8.1)
eGFR: 62 mL/min/{1.73_m2} (ref >=60)

## 2022-07-23 LAB — TSH: TSH: 1.82 mIU/L (ref 0.40–4.50)

## 2022-07-23 LAB — LIPID PANEL
Cholesterol: 124 mg/dL (ref <200)
HDL: 44 mg/dL — ABNORMAL LOW (ref >=50)
LDL Cholesterol (Calc): 63 mg/dL (calc)
Non-HDL Cholesterol (Calc): 80 mg/dL (calc) (ref <130)
Total CHOL/HDL Ratio: 2.8 (calc) (ref <5.0)
Triglycerides: 88 mg/dL (ref <150)

## 2022-08-30 ENCOUNTER — Telehealth (HOSPITAL_COMMUNITY): Payer: Self-pay | Admitting: *Deleted

## 2022-08-30 NOTE — Telephone Encounter (Signed)
-----   Message from Sandra [name] sent at 08/27/2022  4:26 PM EST ----- Called and she said she need to wait til her wound heal. Stated she have people coming to assist her, and she can't walk good right now.  ----- Message ----- From: Juluis Mire, RN Sent: 08/27/2022   4:06 PM EST To: Sandra [name]  Pt needs follow up appt per dr Johnsie Cancel within the next month.

## 2022-10-07 ENCOUNTER — Encounter (HOSPITAL_BASED_OUTPATIENT_CLINIC_OR_DEPARTMENT_OTHER): Payer: Medicare Other | Admitting: General Surgery

## 2022-10-28 ENCOUNTER — Encounter (HOSPITAL_BASED_OUTPATIENT_CLINIC_OR_DEPARTMENT_OTHER): Payer: Medicare Other | Attending: General Surgery | Admitting: Internal Medicine

## 2022-10-28 DIAGNOSIS — L97812 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posed: Secondary | ICD-10-CM

## 2022-10-28 DIAGNOSIS — I5032 Chronic diastolic (congestive) heart failure: Secondary | ICD-10-CM | POA: Diagnosis not present

## 2022-10-28 DIAGNOSIS — X58XXXA Exposure to other specified factors, initial encounter: Secondary | ICD-10-CM | POA: Diagnosis not present

## 2022-10-28 DIAGNOSIS — I890 Lymphedema, not elsewhere classified: Secondary | ICD-10-CM | POA: Diagnosis not present

## 2022-10-28 DIAGNOSIS — Z7901 Long term (current) use of anticoagulants: Secondary | ICD-10-CM | POA: Insufficient documentation

## 2022-10-28 DIAGNOSIS — I480 Paroxysmal atrial fibrillation: Secondary | ICD-10-CM | POA: Insufficient documentation

## 2022-10-28 DIAGNOSIS — Z87891 Personal history of nicotine dependence: Secondary | ICD-10-CM | POA: Insufficient documentation

## 2022-10-28 DIAGNOSIS — T798XXA Other early complications of trauma, initial encounter: Secondary | ICD-10-CM | POA: Diagnosis not present

## 2022-10-28 DIAGNOSIS — I87311 Chronic venous hypertension (idiopathic) with ulcer of right lower extremity: Secondary | ICD-10-CM | POA: Diagnosis present

## 2022-10-28 DIAGNOSIS — I110 Hypertensive heart disease with heart failure: Secondary | ICD-10-CM | POA: Insufficient documentation

## 2022-10-29 NOTE — Progress Notes (Signed)
HATSUMI, STEINHART ([id number]) [id number] Page 1 of 7 Visit Report for 10/28/2022 Allergy List Details Patient Name: Date of Service: Sandra [name] 10/28/2022 9:45 A M Medical Record Number: [id number] Patient Account Number: [id number] Date of Birth/Sex: Treating RN: 05-Mar-1946 (77 y.o. Arta Silence Primary Care Jace Fermin: PA Zenovia Jordan, [place] Other Clinician: Referring Meaghan Whistler: Treating Wyona Neils/Extender: Junious Silk in Treatment: 0 Allergies Active Allergies No Known Drug Allergies Allergy Notes Electronic Signature(s) Signed: 10/28/2022 5:02:42 PM By: Karie Schwalbe RN Entered By: Karie Schwalbe on 10/28/2022 09:45:13 -------------------------------------------------------------------------------- Arrival Information Details Patient Name: Date of Service: Sandra Mo, DO RO [name] 10/28/2022 9:45 A M Medical Record Number: [id number] Patient Account Number: [id number] Date of Birth/Sex: Treating RN: [birth year] (77 y.o. [name] Primary Care Mykeria Garman: PA Zenovia Jordan, [place] Other Clinician: Referring Shamica Moree: Treating Danyetta Gillham/Extender: Junious Silk in Treatment: 0 Visit Information Patient Arrived: Walker Arrival Time: 09:30 Accompanied By: granddaughter Transfer Assistance: Manual Patient Identification Verified: Yes Patient Has Alerts: Yes Patient Alerts: Patient on Blood Thinner Eliquis Lasix Electronic Signature(s) Signed: 10/28/2022 5:02:42 PM By: Karie Schwalbe RN Entered By: Karie Schwalbe on 10/28/2022 09:41:33 -------------------------------------------------------------------------------- Encounter Discharge Information Details Patient Name: Date of Service: Sandra Mo, DO RO [name] 10/28/2022 9:45 A M Medical Record Number: [id number] Patient Account Number: [id number] Date of Birth/Sex: Treating RN: [birth year] (76 y.o. [name] Primary Care Zykira Matlack: PA Zenovia Jordan, [place] Other  Clinician: Referring Daeron Carreno: Treating Rainie Crenshaw/Extender: Junious Silk in Treatment: 0 Encounter Discharge Information Items Post Procedure Vitals Discharge Condition: Stable Temperature (F): 97.9 Ambulatory Status: Walker Pulse (bpm): 74 Discharge Destination: Home Respiratory Rate (breaths/min): 16 Transportation: Private Auto Blood Pressure (mmHg): 140/86 Accompanied By: granddaughter Schedule Follow-up Appointment: Yes Clinical Summary of Care: Patient Sandra [name], Sandra [name] ([id number]) [phone].pdf Page 2 of 7 Electronic Signature(s) Signed: 10/28/2022 5:02:42 PM By: Karie Schwalbe RN Entered By: Karie Schwalbe on 10/28/2022 16:30:02 -------------------------------------------------------------------------------- Lower Extremity Assessment Details Patient Name: Date of Service: Sandra Mo, DO [place] B. 10/28/2022 9:45 A M Medical Record Number: [id number] Patient Account Number: [id number] Date of Birth/Sex: Treating RN: 1946-05-30 (77 y.o. [name] Primary Care Johny Pitstick: PA Zenovia Jordan, [place] Other Clinician: Referring Slyvester Latona: Treating Airyanna Dipalma/Extender: Junious Silk in Treatment: 0 Edema Assessment Assessed: [Left: No] [Right: No] [Left: Edema] [Right: :] Calf Left: Right: Point of Measurement: 35 cm From Medial Instep 56 cm Ankle Left: Right: Point of Measurement: 11 cm From Medial Instep 43.5 cm Knee To Floor Left: Right: From Medial Instep 42 cm Vascular Assessment Pulses: Dorsalis Pedis Palpable: [Right:Yes] Blood Pressure: Brachial: [Right:140] Ankle: [Right:Dorsalis Pedis: 160 1.14] Electronic Signature(s) Signed: 10/28/2022 5:02:42 PM By: Karie Schwalbe RN Entered By: Karie Schwalbe on 10/28/2022 10:06:55 -------------------------------------------------------------------------------- Multi Wound Chart Details Patient Name: Date of Service: Sandra Mo, DO RO [name]  10/28/2022 9:45 A M Medical Record Number: [id number] Patient Account Number: [id number] Date of Birth/Sex: Treating RN: Oct 20, 1945 (77 y.o. F) Primary Care Danaiya Steadman: PA Zenovia Jordan, [place] Other Clinician: Referring Lela Gell: Treating Bannie Lobban/Extender: Junious Silk in Treatment: 0 Vital Signs Height(in): 63 Pulse(bpm): 74 Weight(lbs): 285 Blood Pressure(mmHg): 140/86 Body Mass Index(BMI): 50.5 Temperature(F): 97.9 Respiratory Rate(breaths/min): 16 [1:Photos:] [N/A:N/A] Right, Lateral Lower Leg N/A N/A Wound Location: Gradually Appeared N/A N/A Wounding Event: Lymphedema N/A N/A Primary Etiology: Arrhythmia, Congestive Heart Failure, N/A N/A Comorbid History: Hypertension 09/09/2022 N/A N/A Date Acquired: 0 N/A N/A Weeks of Treatment: Open N/A N/A Wound Status: No N/A N/A Wound  Recurrence: 14x10.2x0.1 N/A N/A Measurements L x W x D (cm) 112.155 N/A N/A A (cm) : rea 11.215 N/A N/A Volume (cm) : Full Thickness Without Exposed N/A N/A Classification: Support Structures Large N/A N/A Exudate A mount: Serosanguineous N/A N/A Exudate Type: red, brown N/A N/A Exudate Color: Distinct, outline attached N/A N/A Wound Margin: Medium (34-66%) N/A N/A Granulation A mount: Red, Pink N/A N/A Granulation Quality: Medium (34-66%) N/A N/A Necrotic A mount: Fat Layer (Subcutaneous Tissue): Yes N/A N/A Exposed Structures: Fascia: No Tendon: No Muscle: No Joint: No Bone: No None N/A N/A Epithelialization: Debridement - Excisional N/A N/A Debridement: Pre-procedure Verification/Time Out 10:25 N/A N/A Taken: Lidocaine 4% Topical Solution N/A N/A Pain Control: Subcutaneous, Slough N/A N/A Tissue Debrided: Skin/Subcutaneous Tissue N/A N/A Level: 112.1 N/A N/A Debridement A (sq cm): rea Curette N/A N/A Instrument: Minimum N/A N/A Bleeding: Pressure N/A N/A Hemostasis A chieved: 0 N/A N/A Procedural Pain: 0 N/A N/A Post Procedural  Pain: Procedure was tolerated well N/A N/A Debridement Treatment Response: 14x10.2x0.1 N/A N/A Post Debridement Measurements L x W x D (cm) 11.215 N/A N/A Post Debridement Volume: (cm) Excoriation: No N/A N/A Periwound Skin Texture: Induration: No Callus: No Crepitus: No Rash: No Scarring: No Dry/Scaly: Yes N/A N/A Periwound Skin Moisture: Maceration: No Hemosiderin Staining: Yes N/A N/A Periwound Skin Color: Atrophie Blanche: No Cyanosis: No Ecchymosis: No Erythema: No Mottled: No Pallor: No Rubor: No No Abnormality N/A N/A Temperature: Debridement N/A N/A Procedures Performed: Treatment Notes Electronic Signature(s) Signed: 10/28/2022 2:38:20 PM By: Geralyn Corwin DO Entered By: Geralyn Corwin on 10/28/2022 10:40:04 -------------------------------------------------------------------------------- Multi-Disciplinary Care Plan Details Patient Name: Date of Service: Sandra Mo, DO RO THY B. 10/28/2022 9:45 A Satira Sark, Willis Modena ([id number]) [id number] Page 4 of 7 Medical Record Number: [id number] Patient Account Number: [id number] Date of Birth/Sex: Treating RN: 09-Sep-1945 (77 y.o. [name] Primary Care Tobyn Osgood: PA Zenovia Jordan, [place] Other Clinician: Referring Joscelyn Hardrick: Treating Gorje Iyer/Extender: Junious Silk in Treatment: 0 Active Inactive Wound/Skin Impairment Nursing Diagnoses: Impaired tissue integrity Goals: Patient/caregiver will verbalize understanding of skin care regimen Date Initiated: 10/28/2022 Target Resolution Date: 01/02/2023 Goal Status: Active Interventions: Assess ulceration(s) every visit Treatment Activities: Skin care regimen initiated : 10/28/2022 Notes: Electronic Signature(s) Signed: 10/28/2022 5:02:42 PM By: Karie Schwalbe RN Entered By: Karie Schwalbe on 10/28/2022 14:00:52 -------------------------------------------------------------------------------- Pain Assessment  Details Patient Name: Date of Service: Sandra Mo, DO RO [name] 10/28/2022 9:45 A M Medical Record Number: [id number] Patient Account Number: [id number] Date of Birth/Sex: Treating RN: 03/22/1946 (77 y.o. [name] Primary Care Brylan Dec: PA Zenovia Jordan, [place] Other Clinician: Referring Kellar Westberg: Treating Caragh Gasper/Extender: Junious Silk in Treatment: 0 Active Problems Location of Pain Severity and Description of Pain Patient Has Paino No Site Locations Pain Management and Medication Current Pain Management: Electronic Signature(s) Signed: 10/28/2022 5:02:42 PM By: Karie Schwalbe RN Entered By: Karie Schwalbe on 10/28/2022 10:07:41 Jeannie Fend ([id number]) [id number] Page 5 of 7 -------------------------------------------------------------------------------- Patient/Caregiver Education Details Patient Name: Date of Service: Sandra [name], [place] RO [place] 4/25/2024andnbsp9:45 A M Medical Record Number: [id number] Patient Account Number: [id number] Date of Birth/Gender: Treating RN: 12-19-1945 (77 y.o. [name] Primary Care Physician: PA Zenovia Jordan, [place] Other Clinician: Referring Physician: Treating Physician/Extender: Junious Silk in Treatment: 0 Education Assessment Education Provided To: Patient Education Topics Provided Wound/Skin Impairment: Methods: Explain/Verbal Responses: Return demonstration correctly Electronic Signature(s) Signed: 10/28/2022 5:02:42 PM By: Karie Schwalbe RN Entered By: Karie Schwalbe on 10/28/2022 14:01:03 -------------------------------------------------------------------------------- Wound  Assessment Details Patient Name: Date of Service: Sandra [name] 10/28/2022 9:45 A M Medical Record Number: [id number] Patient Account Number: [id number] Date of Birth/Sex: Treating RN: [birth year] (77 y.o. [name] Primary Care Yareni Creps: PA Zenovia Jordan, [place] Other  Clinician: Referring Brigitta Pricer: Treating Biance Moncrief/Extender: Junious Silk in Treatment: 0 Wound Status Wound Number: 1 Primary Etiology: Lymphedema Wound Location: Right, Lateral Lower Leg Wound Status: Open Wounding Event: Gradually Appeared Comorbid History: Arrhythmia, Congestive Heart Failure, Hypertension Date Acquired: 09/09/2022 Weeks Of Treatment: 0 Clustered Wound: No Photos Wound Measurements Length: (cm) 14 Width: (cm) 10.2 Depth: (cm) 0.1 Area: (cm) 112.155 Volume: (cm) 11.215 % Reduction in Area: % Reduction in Volume: Epithelialization: None Tunneling: No Undermining: No Wound Description Classification: Full Thickness Without Exposed Support Structures Wound Margin: Distinct, outline attached Lopezgarcia, Jadda B ([id number]) Exudate Amount: Large Exudate Type: Serosanguineous Exudate Color: red, brown Foul Odor After Cleansing: No Slough/Fibrino Yes [phone].pdf Page 6 of 7 Wound Bed Granulation Amount: Medium (34-66%) Exposed Structure Granulation Quality: Red, Pink Fascia Exposed: No Necrotic Amount: Medium (34-66%) Fat Layer (Subcutaneous Tissue) Exposed: Yes Necrotic Quality: Adherent Slough Tendon Exposed: No Muscle Exposed: No Joint Exposed: No Bone Exposed: No Periwound Skin Texture Texture Color No Abnormalities Noted: No No Abnormalities Noted: No Callus: No Atrophie Blanche: No Crepitus: No Cyanosis: No Excoriation: No Ecchymosis: No Induration: No Erythema: No Rash: No Hemosiderin Staining: Yes Scarring: No Mottled: No Pallor: No Moisture Rubor: No No Abnormalities Noted: No Dry / Scaly: Yes Temperature / Pain Maceration: No Temperature: No Abnormality Treatment Notes Wound #1 (Lower Leg) Wound Laterality: Right, Lateral Cleanser Soap and Water Discharge Instruction: May shower and wash wound with dial antibacterial soap and water prior to dressing change. Vashe 5.8  (oz) Discharge Instruction: Cleanse the wound with Vashe prior to applying a clean dressing using gauze sponges, not tissue or cotton balls. Peri-Wound Care Sween Lotion (Moisturizing lotion) Discharge Instruction: Apply moisturizing lotion as directed Topical Gentamicin Discharge Instruction: in clinic only Mupirocin Ointment Discharge Instruction: in clinic only Primary Dressing Maxorb Extra Ag+ Alginate Dressing, 4x4.75 (in/in) Discharge Instruction: Apply to wound bed as instructed Secondary Dressing ABD Pad, 8x10 Discharge Instruction: Apply over primary dressing as directed. Woven Gauze Sponge, Non-Sterile 4x4 in Discharge Instruction: Apply over primary dressing as directed. Secured With Compression Wrap ThreePress (3 layer compression wrap) Discharge Instruction: Or Apply three layer compression as directed. Urgo K2 Lite, two layer compression system, regular Discharge Instruction: Apply Urgo K2 Lite as directed (alternative to 3 layer compression). Unnaboot w/Calamine, 4x10 (in/yd) Discharge Instruction: Apply Unnaboot at top of the leg Stockinette Compression Stockings Add-Ons LEIANA, RUND ([id number]) [phone].pdf Page 7 of 7 Electronic Signature(s) Signed: 10/28/2022 4:51:53 PM By: Shawn Stall RN, BSN Signed: 10/28/2022 5:02:42 PM By: Karie Schwalbe RN Entered By: Shawn Stall on 10/28/2022 10:10:28 -------------------------------------------------------------------------------- Vitals Details Patient Name: Date of Service: Sandra Mo, DO RO THY B. 10/28/2022 9:45 A M Medical Record Number: [id number] Patient Account Number: [id number] Date of Birth/Sex: Treating RN: Dec 31, 1945 (77 y.o. [name] Primary Care Maliq Pilley: PA Zenovia Jordan, [place] Other Clinician: Referring Cooper Stamp: Treating Eagle Pitta/Extender: Junious Silk in Treatment: 0 Vital Signs Time Taken: 09:40 Temperature (F): 97.9 Height (in):  63 Pulse (bpm): 74 Source: Stated Respiratory Rate (breaths/min): 16 Weight (lbs): 285 Blood Pressure (mmHg): 140/86 Source: Stated Reference Range: 80 - 120 mg / dl Body Mass Index (BMI): 50.5 Electronic Signature(s) Signed: 10/28/2022 5:02:42 PM By: Karie Schwalbe RN Entered By: Karie Schwalbe  on 10/28/2022 09:44:47

## 2022-10-29 NOTE — Progress Notes (Signed)
Sandra [name] ([id number]) [phone].pdf Page 1 of 4 Visit Report for 10/28/2022 Abuse Risk Screen Details Patient Name: Date of Service: Sandra [name] 10/28/2022 9:45 A M Medical Record Number: [id number] Patient Account Number: [id number] Date of Birth/Sex: Treating RN: [birth year] (77 y.o. [name] Primary Care Rhoderick Farrel: PA Zenovia Jordan, [name] Other Clinician: Referring Polo Mcmartin: Treating Yerachmiel Spinney/Extender: Junious Silk in Treatment: 0 Abuse Risk Screen Items Answer ABUSE RISK SCREEN: Has anyone close to you tried to hurt or harm you recentlyo No Sandra you feel uncomfortable with anyone in your familyo No Has anyone forced you Sandra things that you didnt want to doo No Electronic Signature(s) Signed: 10/28/2022 5:02:42 PM By: Karie Schwalbe RN Entered By: Karie Schwalbe on 10/28/2022 09:51:27 -------------------------------------------------------------------------------- Activities of Daily Living Details Patient Name: Date of Service: Sandra [name] 10/28/2022 9:45 A M Medical Record Number: [id number] Patient Account Number: [id number] Date of Birth/Sex: Treating RN: 28-Feb-1946 (77 y.o. [name] Primary Care Evoleht Hovatter: PA Zenovia Jordan, [name] Other Clinician: Referring Merrill Deanda: Treating Jye Fariss/Extender: Junious Silk in Treatment: 0 Activities of Daily Living Items Answer Activities of Daily Living (Please select one for each item) Drive Automobile Need Assistance T Medications ake Completely Able Use T elephone Completely Able Care for Appearance Completely Able Use T oilet Completely Able Bath / Shower Completely Able Dress Self Completely Able Feed Self Completely Able Walk Completely Able Get In / Out Bed Completely Able Housework Completely Able Prepare Meals Completely Able Handle Money Completely Able Shop for Self Completely Able Electronic Signature(s) Signed:  10/28/2022 5:02:42 PM By: Karie Schwalbe RN Entered By: Karie Schwalbe on 10/28/2022 09:52:26 -------------------------------------------------------------------------------- Education Screening Details Patient Name: Date of Service: Sandra [name], Sandra [name]. 10/28/2022 9:45 A M Medical Record Number: [id number] Patient Account Number: [id number] Date of Birth/Sex: Treating RN: 1945-08-31 (77 y.o. [name] Primary Care Trust Crago: PA Zenovia Jordan, [name] Other Clinician: Referring Brynja Marker: Treating Bronsen Serano/Extender: Junious Silk in TreatmentAVALINE, [name] ([id number]) [id number] Page 2 of 4 Primary Learner Assessed: Patient Learning Preferences/Education Level/Primary Language Learning Preference: Explanation, Demonstration, Printed Material Highest Education Level: College or Above Preferred Language: Economist Language Barrier: No Translator Needed: No Memory Deficit: No Emotional Barrier: No Cultural/Religious Beliefs Affecting Medical Care: No Physical Barrier Impaired Vision: No Impaired Hearing: No Decreased Hand dexterity: No Knowledge/Comprehension Knowledge Level: High Comprehension Level: High Ability to understand written instructions: High Ability to understand verbal instructions: High Motivation Anxiety Level: Calm Cooperation: Cooperative Education Importance: Acknowledges Need Interest in Health Problems: Asks Questions Perception: Coherent Willingness to Engage in Self-Management High Activities: Readiness to Engage in Self-Management High Activities: Electronic Signature(s) Signed: 10/28/2022 5:02:42 PM By: Karie Schwalbe RN Entered By: Karie Schwalbe on 10/28/2022 09:53:51 -------------------------------------------------------------------------------- Fall Risk Assessment Details Patient Name: Date of Service: Sandra [name], Sandra [name]. 10/28/2022 9:45 A M Medical Record  Number: [id number] Patient Account Number: [id number] Date of Birth/Sex: Treating RN: 01/12/1946 (77 y.o. [name] Primary Care Brittain Smithey: PA Zenovia Jordan, [name] Other Clinician: Referring Georgean Spainhower: Treating Janeya Deyo/Extender: Junious Silk in Treatment: 0 Fall Risk Assessment Items Have you had 2 or more falls in the last 12 monthso 0 No Have you had any fall that resulted in injury in the last 12 monthso 0 No FALLS RISK SCREEN History of falling - immediate or within 3 months 0 No Secondary diagnosis (Sandra you have 2 or more medical diagnoseso) 0  No Ambulatory aid None/bed rest/wheelchair/nurse 0 No Crutches/cane/walker 0 No Furniture 0 No Intravenous therapy Access/Saline/Heparin Lock 0 No Gait/Transferring Normal/ bed rest/ wheelchair 0 No Weak (short steps with or without shuffle, stooped but able to lift head while walking, may seek 0 No support from furniture) Impaired (short steps with shuffle, may have difficulty arising from chair, head down, impaired 0 No balance) Mental Status Oriented to own ability 0 No Overestimates or forgets limitations 0 No Risk Level: Low Risk Score: 0 Sandra [name], Sandra [name] ([id number]) [phone] [id number] Page 3 of 4 Electronic Signature(s) -------------------------------------------------------------------------------- Foot Assessment Details Patient Name: Date of Service: Sandra [name] 10/28/2022 9:45 A M Medical Record Number: [id number] Patient Account Number: [id number] Date of Birth/Sex: Treating RN: [birth year] (77 y.o. [name] Primary Care Jaquel Glassburn: PA TIENT, [name] Other Clinician: Referring Zoriah Pulice: Treating Oliwia Berzins/Extender: Junious Silk in Treatment: 0 Foot Assessment Items Site Locations + = Sensation present, - = Sensation absent, C = Callus, U = Ulcer R = Redness, W = Warmth, M = Maceration, PU = Pre-ulcerative lesion F = Fissure, S =  Swelling, D = Dryness Assessment Right: Left: Other Deformity: No No Prior Foot Ulcer: No No Prior Amputation: No No Charcot Joint: No No Ambulatory Status: Ambulatory With Help Assistance Device: Walker Gait: Steady Electronic Signature(s) Signed: 10/28/2022 5:02:42 PM By: Karie Schwalbe RN Entered By: Karie Schwalbe on 10/28/2022 09:56:47 -------------------------------------------------------------------------------- Nutrition Risk Screening Details Patient Name: Date of Service: Sandra Dom [name]. 10/28/2022 9:45 A M Medical Record Number: [id number] Patient Account Number: [id number] Date of Birth/Sex: Treating RN: Sep 16, 1945 (77 y.o. [name] Primary Care Isiaah Cuervo: PA Zenovia Jordan, [name] Other Clinician: Referring Jacqualyn Sedgwick: Treating Torrian Canion/Extender: Junious Silk in Treatment: 0 Height (in): 63 Weight (lbs): 285 Body Mass Index (BMI): 50.5 Sandra [name], Sandra [name] ([id number]) [phone] [id number] Page 4 of 4 Nutrition Risk Screening Items Score Screening NUTRITION RISK SCREEN: I have an illness or condition that made me change the kind and/or amount of food I eat 0 No I eat fewer than two meals per day 0 No I eat few fruits and vegetables, or milk products 0 No I have three or more drinks of beer, liquor or wine almost every day 0 No I have tooth or mouth problems that make it hard for me to eat 0 No I don't always have enough money to buy the food I need 0 No I eat alone most of the time 0 No I take three or more different prescribed or over-the-counter drugs a day 0 No Without wanting to, I have lost or gained 10 pounds in the last six months 0 No I am not always physically able to shop, cook and/or feed myself 0 No Nutrition Protocols Good Risk Protocol 0 No interventions needed Moderate Risk Protocol High Risk Proctocol Risk Level: Good Risk Score: 0 Electronic Signature(s) Signed: 10/28/2022 5:02:42 PM By:  Karie Schwalbe RN Entered By: Karie Schwalbe on 10/28/2022 09:54:17

## 2022-11-01 NOTE — Progress Notes (Signed)
Sandra, [name] ([id number]) [id number] Page 1 of 8 Visit Report for 10/28/2022 Chief Complaint Document Details Patient Name: Date of Service: Sandra [name] 10/28/2022 9:45 A M Medical Record Number: [id number] Patient Account Number: [id number] Date of Birth/Sex: Treating [name]: 09/18/1945 (77 y.o. F) Primary Care Provider: PA Zenovia Jordan, [place] Other Clinician: Referring Provider: Treating Provider/Extender: Junious Silk in Treatment: 0 Information Obtained from: Patient Chief Complaint 10/28/2022; right distal lower extremity wound Electronic Signature(s) Signed: 10/28/2022 2:38:20 PM By: Geralyn Corwin Sandra Entered By: Geralyn Corwin on 10/28/2022 10:40:28 -------------------------------------------------------------------------------- Debridement Details Patient Name: Date of Service: Sandra [name], Sandra RO Sandra [name]. 10/28/2022 9:45 A M Medical Record Number: [id number] Patient Account Number: [id number] Date of Birth/Sex: Treating [name]: [birth year] (77 y.o. [name] Primary Care Provider: PA Zenovia Jordan, [place] Other Clinician: Referring Provider: Treating Provider/Extender: Junious Silk in Treatment: 0 Debridement Performed for Assessment: Wound #1 Right,Lateral Lower Leg Performed By: Physician Geralyn Corwin, Sandra Debridement Type: Debridement Level of Consciousness (Pre-procedure): Awake and Alert Pre-procedure Verification/Time Out Yes - 10:25 Taken: Start Time: 10:25 Pain Control: Lidocaine 4% T opical Solution Percent of Wound Bed Debrided: 100% T Area Debrided (cm): otal 112.1 Tissue and other material debrided: Non-Viable, Slough, Subcutaneous, Slough Level: Skin/Subcutaneous Tissue Debridement Description: Excisional Instrument: Curette Bleeding: Minimum Hemostasis Achieved: Pressure End Time: 10:26 Procedural Pain: 0 Post Procedural Pain: 0 Response to Treatment: Procedure was tolerated  well Level of Consciousness (Post- Awake and Alert procedure): Post Debridement Measurements of Total Wound Length: (cm) 14 Width: (cm) 10.2 Depth: (cm) 0.1 Volume: (cm) 11.215 Character of Wound/Ulcer Post Debridement: Improved Post Procedure Diagnosis Same as Pre-procedure Notes Scribed for Dr. Mikey Bussing by J.Scotton Electronic Signature(s) Signed: 10/28/2022 2:38:20 PM By: Wilton Lions, Willis Modena ([id number]) [id number] Page 2 of 8 Signed: 10/28/2022 5:02:42 PM By: Karie Schwalbe [name] Entered By: Karie Schwalbe on 10/28/2022 10:38:58 -------------------------------------------------------------------------------- HPI Details Patient Name: Date of Service: Sandra [name], Sandra [name]. 10/28/2022 9:45 A M Medical Record Number: [id number] Patient Account Number: [id number] Date of Birth/Sex: Treating [name]: 05/26/1946 (77 y.o. F) Primary Care Provider: PA Zenovia Jordan, [place] Other Clinician: Referring Provider: Treating Provider/Extender: Junious Silk in Treatment: 0 History of Present Illness HPI Description: 10/28/2022 Ms. Sandra [name] is a 77 year old female with a past medical history of of diastolic heart failure, paroxysmal A-fib on Eliquis, and lymphedema that presents to the clinic for a 1-43-month history of nonhealing ulcer to the right lower extremity. She states she hit her leg against a wheelchair and it created a wound. Over the past 1 to 2 months the area has gotten larger. She states she has been on all amoxicillin per her PCP for this issue. She is not currently on antibiotics And denies systemic signs of infection. She has been using silver alginate to the wound bed. She does not wear compression stockings. Electronic Signature(s) Signed: 10/28/2022 2:38:20 PM By: Geralyn Corwin Sandra Entered By: Geralyn Corwin on 10/28/2022  10:42:04 -------------------------------------------------------------------------------- Physical Exam Details Patient Name: Date of Service: Sandra [name], Sandra RO Sandra [name]. 10/28/2022 9:45 A M Medical Record Number: [id number] Patient Account Number: [id number] Date of Birth/Sex: Treating [name]: 04/11/1946 (77 y.o. F) Primary Care Provider: PA Zenovia Jordan, [place] Other Clinician: Referring Provider: Treating Provider/Extender: Junious Silk in Treatment: 0 Constitutional respirations regular, non-labored and within target range for patient.. Cardiovascular 2+ dorsalis pedis/posterior tibialis pulses. Psychiatric pleasant and cooperative. Notes Right lower extremity: T the distal aspect  near the ankle there is a large open wound with granulation tissue and nonviable tissue. 2+ pitting edema to the knee. o Stage III lymphedema. No surrounding signs of infection including increased warmth, erythema or purulent drainage. Electronic Signature(s) Signed: 10/28/2022 2:38:20 PM By: Geralyn Corwin Sandra Entered By: Geralyn Corwin on 10/28/2022 10:44:17 -------------------------------------------------------------------------------- Physician Orders Details Patient Name: Date of Service: Sandra [name], Sandra RO Sandra [name]. 10/28/2022 9:45 A M Medical Record Number: [id number] Patient Account Number: [id number] Date of Birth/Sex: Treating [name]: 1946/03/14 (77 y.o. [name] Primary Care Provider: PA Zenovia Jordan, [place] Other Clinician: Referring Provider: Treating Provider/Extender: Junious Silk in Treatment: 0 Verbal / Phone Orders: No Diagnosis Coding ICD-10 Coding Sandra, [name] ([id number]) [id number] Page 3 of 8 Code Description 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 I89.0 Lymphedema, not elsewhere classified T79.8XXA Other early complications of trauma, initial encounter I48.0 Paroxysmal  atrial fibrillation Z79.01 Long term (current) use of anticoagulants Follow-up Appointments ppointment in 1 week. - Dr. Mikey Bussing Room 7 Return A Anesthetic (In clinic) Topical Lidocaine 4% applied to wound bed Bathing/ Shower/ Hygiene May shower with protection but Sandra not get wound dressing(s) wet. Protect dressing(s) with water repellant cover (for example, large plastic bag) or a cast cover and may then take shower. - Sandra not get Right leg wraps wet. Use a cast protector. Can purchase from [facility], [facility],[employer] etc. Cost is approximately $20-$35. Other Bathing/Shower/Hygiene Orders/Instructions: - May Sandra a sponge bath. Edema Control - Lymphedema / SCD / Other Bilateral Lower Extremities Elevate legs to the level of the heart or above for 30 minutes daily and/or when sitting for 3-4 times a day throughout the day. Avoid standing for long periods of time. Exercise regularly - As T olerated Moisturize legs daily. - Left leg only until Right leg is healed, then put moisturizer on right leg when right leg is healed. Off-Loading Other: - When sitting-please put legs up! (elevate legs while sitting) Home Health Wound #1 Right,Lateral Lower Leg Other Home Health Orders/Instructions: - [facility] please change Right leg wound dressing on Mondays. pt. will come to the wound clinic on Thursday. Wound Treatment Wound #1 - Lower Leg Wound Laterality: Right, Lateral Cleanser: Soap and Water 2 x Per Week/30 Days Discharge Instructions: May shower and wash wound with dial antibacterial soap and water prior to dressing change. Cleanser: Vashe 5.8 (oz) 2 x Per Week/30 Days Discharge Instructions: Cleanse the wound with Vashe prior to applying a clean dressing using gauze sponges, not tissue or cotton balls. Peri-Wound Care: Sween Lotion (Moisturizing lotion) 2 x Per Week/30 Days Discharge Instructions: Apply moisturizing lotion as directed Topical: Gentamicin 2 x Per Week/30 Days Discharge  Instructions: in clinic only Topical: Mupirocin Ointment 2 x Per Week/30 Days Discharge Instructions: in clinic only Prim Dressing: Maxorb Extra Ag+ Alginate Dressing, 4x4.75 (in/in) 2 x Per Week/30 Days ary Discharge Instructions: Apply to wound bed as instructed Secondary Dressing: ABD Pad, 8x10 2 x Per Week/30 Days Discharge Instructions: Apply over primary dressing as directed. Secondary Dressing: Woven Gauze Sponge, Non-Sterile 4x4 in 2 x Per Week/30 Days Discharge Instructions: Apply over primary dressing as directed. Compression Wrap: ThreePress (3 layer compression wrap) 2 x Per Week/30 Days Discharge Instructions: Or Apply three layer compression as directed. Compression Wrap: Urgo K2 Lite, two layer compression system, regular 2 x Per Week/30 Days Discharge Instructions: Apply Urgo K2 Lite as directed (alternative to 3 layer compression). Compression Wrap: Unnaboot w/Calamine, 4x10 (in/yd)  2 x Per Week/30 Days Discharge Instructions: Apply Unnaboot at top of the leg Compression Wrap: Stockinette 2 x Per Week/30 Days Electronic Signature(s) Signed: 10/28/2022 2:38:20 PM By: Geralyn Corwin Sandra Signed: 10/28/2022 5:02:42 PM By: Karie Schwalbe [name] Sandra [name], Sandra [name] ([id number]) [phone].pdf Page 4 of 8 Entered By: Karie Schwalbe on 10/28/2022 10:53:57 -------------------------------------------------------------------------------- Problem List Details Patient Name: Date of Service: Sandra [name] 10/28/2022 9:45 A M Medical Record Number: [id number] Patient Account Number: [id number] Date of Birth/Sex: Treating [name]: [birth year] (77 y.o. F) Primary Care Provider: PA TIENT, NO Other Clinician: Referring Provider: Treating Provider/Extender: Junious Silk in Treatment: 0 Active Problems ICD-10 Encounter Code Description Active Date MDM Diagnosis L97.812 Non-pressure chronic ulcer of other part of right lower leg with fat  layer 10/28/2022 No Yes exposed 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 10/28/2022 No Yes I89.0 Lymphedema, not elsewhere classified 10/28/2022 No Yes T79.8XXA Other early complications of trauma, initial encounter 10/28/2022 No Yes I48.0 Paroxysmal atrial fibrillation 10/28/2022 No Yes Z79.01 Long term (current) use of anticoagulants 10/28/2022 No Yes Inactive Problems Resolved Problems Electronic Signature(s) Signed: 10/28/2022 2:38:20 PM By: Geralyn Corwin Sandra Entered By: Geralyn Corwin on 10/28/2022 10:49:56 -------------------------------------------------------------------------------- Progress Note Details Patient Name: Date of Service: Sandra [name], Sandra RO Sandra [name]. 10/28/2022 9:45 A M Medical Record Number: [id number] Patient Account Number: [id number] Date of Birth/Sex: Treating [name]: [birth year] (77 y.o. F) Primary Care Provider: PA Zenovia Jordan, [place] Other Clinician: Referring Provider: Treating Provider/Extender: Junious Silk in Treatment: 0 Subjective Chief Complaint Information obtained from Patient 10/28/2022; right distal lower extremity wound History of Present Illness (HPI) 10/28/2022 Ms. Sandra [name] is a 77 year old female with a past medical history of of diastolic heart failure, paroxysmal A-fib on Eliquis, and lymphedema that Sandra [name], Sandra [name] ([id number]) [id number] Page 5 of 8 presents to the clinic for a 1-62-month history of nonhealing ulcer to the right lower extremity. She states she hit her leg against a wheelchair and it created a wound. Over the past 1 to 2 months the area has gotten larger. She states she has been on all amoxicillin per her PCP for this issue. She is not currently on antibiotics And denies systemic signs of infection. She has been using silver alginate to the wound bed. She does not wear compression stockings. Patient History Information obtained from Patient,  Chart. Allergies No Known Drug Allergies Family History Cancer - Mother, Lung Disease - Father. Social History Former smoker - quit 1994, Alcohol Use - Never, Drug Use - No History, Caffeine Use - Never. Medical History Cardiovascular Patient has history of Arrhythmia - A. fib, Congestive Heart Failure, Hypertension Hospitalization/Surgery History - Tonsillectomy (pt. states at 77 yrs old). Medical A Surgical History Notes nd Cardiovascular lymphedema Review of Systems (ROS) Integumentary (Skin) Complains or has symptoms of Wounds - feet and heels. Right Leg wound. Objective Constitutional respirations regular, non-labored and within target range for patient.. Vitals Time Taken: 9:40 AM, Height: 63 in, Source: Stated, Weight: 285 lbs, Source: Stated, BMI: 50.5, Temperature: 97.9 F, Pulse: 74 bpm, Respiratory Rate: 16 breaths/min, Blood Pressure: 140/86 mmHg. Cardiovascular 2+ dorsalis pedis/posterior tibialis pulses. Psychiatric pleasant and cooperative. General Notes: Right lower extremity: T the distal aspect near the ankle there is a large open wound with granulation tissue and nonviable tissue. 2+ pitting o edema to the knee. Stage III lymphedema. No surrounding signs of infection including increased warmth, erythema or purulent drainage. Integumentary (Hair, Skin) Wound #  1 status is Open. Original cause of wound was Gradually Appeared. The date acquired was: 09/09/2022. The wound is located on the Right,Lateral Lower Leg. The wound measures 14cm length x 10.2cm width x 0.1cm depth; 112.155cm^2 area and 11.215cm^3 volume. There is Fat Layer (Subcutaneous Tissue) exposed. There is no tunneling or undermining noted. There is a large amount of serosanguineous drainage noted. The wound margin is distinct with the outline attached to the wound base. There is medium (34-66%) red, pink granulation within the wound bed. There is a medium (34-66%) amount of necrotic tissue within the  wound bed including Adherent Slough. The periwound skin appearance exhibited: Dry/Scaly, Hemosiderin Staining. The periwound skin appearance did not exhibit: Callus, Crepitus, Excoriation, Induration, Rash, Scarring, Maceration, Atrophie Blanche, Cyanosis, Ecchymosis, Mottled, Pallor, Rubor, Erythema. Periwound temperature was noted as No Abnormality. 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 ABIs in office were 1.14 and she should have adequate blood flow for healing. We discussed the importance of edema control for her wound healing. She keeps her legs down most of the day since she is in a wheelchair. I recommend she elevate them when she is sitting for long periods of time.I recommended an in [place] ([id number]) [id number] Page 6 of 8 office wrap and she was agreeable to this. She knows to not get this wet or keep this on for more than 7 days. Currently no signs of infection. I debrided nonviable tissue. I recommended silver alginate and antibiotic ointment under the wrap. She has home health that will change the wrap once weekly. Procedures Wound #1 Pre-procedure diagnosis of Wound #1 is a Lymphedema located on the Right,Lateral Lower Leg . There was a Excisional Skin/Subcutaneous Tissue Debridement with a total area of 112.1 sq cm performed by Geralyn Corwin, Sandra. With the following instrument(s): Curette to remove Non-Viable tissue/material. Material removed includes Subcutaneous Tissue  and Slough and after achieving pain control using Lidocaine 4% T opical Solution. No specimens were taken. A time out was conducted at 10:25, prior to the start of the procedure. A Minimum amount of bleeding was controlled with Pressure. The procedure was tolerated well with a pain level of 0 throughout and a pain level of 0 following the procedure. Post Debridement Measurements: 14cm length x 10.2cm width x 0.1cm depth; 11.215cm^3 volume. Character of Wound/Ulcer Post Debridement is improved. Post procedure Diagnosis Wound #1: Same as Pre-Procedure General Notes: Scribed for Dr. Mikey Bussing by J.Scotton. Plan Follow-up Appointments: Return Appointment in 1 week. - Dr. Mikey Bussing Room 7 Anesthetic: (In clinic) Topical Lidocaine 4% applied to wound bed Bathing/ Shower/ Hygiene: May shower with protection but Sandra not get wound dressing(s) wet. Protect dressing(s) with water repellant cover (for example, large plastic bag) or a cast cover and may then take shower. - Sandra not get Right leg wraps wet. Use a cast protector. Can purchase from [facility], [facility],[employer] etc. Cost is approximately $20-$35. Other Bathing/Shower/Hygiene Orders/Instructions: - May Sandra a sponge bath. Edema Control - Lymphedema / SCD / Other: Elevate legs to the level of the heart or above for 30 minutes daily and/or when sitting for 3-4 times a day throughout the  day. Avoid standing for long periods of time. Exercise regularly - As T olerated Moisturize legs daily. - Left leg only until Right leg is healed, then put moisturizer on right leg when right leg is healed. Off-Loading: Other: - When sitting-please put legs up! (elevate legs while sitting) Home Health: Wound #1 Right,Lateral Lower Leg: Other Home Health Orders/Instructions: - [facility] please change Right leg wound dressing on Mondays. pt. will come to the wound clinic on Thursday. WOUND #1: - Lower Leg Wound Laterality: Right, Lateral Cleanser: Soap and Water 2  x Per Week/30 Days Discharge Instructions: May shower and wash wound with dial antibacterial soap and water prior to dressing change. Cleanser: Vashe 5.8 (oz) 2 x Per Week/30 Days Discharge Instructions: Cleanse the wound with Vashe prior to applying a clean dressing using gauze sponges, not tissue or cotton balls. Peri-Wound Care: Sween Lotion (Moisturizing lotion) 2 x Per Week/30 Days Discharge Instructions: Apply moisturizing lotion as directed Topical: Gentamicin 2 x Per Week/30 Days Discharge Instructions: in clinic only Topical: Mupirocin Ointment 2 x Per Week/30 Days Discharge Instructions: in clinic only Prim Dressing: Maxorb Extra Ag+ Alginate Dressing, 4x4.75 (in/in) 2 x Per Week/30 Days ary Discharge Instructions: Apply to wound bed as instructed Secondary Dressing: ABD Pad, 8x10 2 x Per Week/30 Days Discharge Instructions: Apply over primary dressing as directed. Secondary Dressing: Woven Gauze Sponge, Non-Sterile 4x4 in 2 x Per Week/30 Days Discharge Instructions: Apply over primary dressing as directed. Com pression Wrap: ThreePress (3 layer compression wrap) 2 x Per Week/30 Days Discharge Instructions: Or Apply three layer compression as directed. Com pression Wrap: Urgo K2 Lite, two layer compression system, regular 2 x Per Week/30 Days Discharge Instructions: Apply Urgo K2 Lite as directed (alternative to 3 layer compression). Com pression Wrap: Unnaboot w/Calamine, 4x10 (in/yd) 2 x Per Week/30 Days Discharge Instructions: Apply Unnaboot at top of the leg Com pression Wrap: Stockinette 2 x Per Week/30 Days 1. In office sharp debridement 2. Antibiotic ointment with silver alginate under 3 layer compressionooright lower extremity 3. Follow-up in 1 week Electronic Signature(s) Signed: 10/28/2022 4:51:53 PM By: Sandra [name], Sandra [name] Signed: 11/01/2022 10:32:07 AM By: Geralyn Corwin Sandra Previous Signature: 10/28/2022 2:38:20 PM Version By: Geralyn Corwin Sandra Entered By:  Sandra Stall on 10/28/2022 16:45:26 HxROS Details -------------------------------------------------------------------------------- Sandra [name] ([id number]) [id number] Page 7 of 8 Patient Name: Date of Service: Sandra [name], [place] 10/28/2022 9:45 A M Medical Record Number: [id number] Patient Account Number: [id number] Date of Birth/Sex: Treating [name]: 05/05/1946 (77 y.o. Sandra [name] Primary Care Provider: PA Zenovia Jordan, [place] Other Clinician: Referring Provider: Treating Provider/Extender: Junious Silk in Treatment: 0 Information Obtained From Patient Chart Integumentary (Skin) Complaints and Symptoms: Positive for: Wounds - feet and heels. Right Leg wound Cardiovascular Medical History: Positive for: Arrhythmia - A. fib; Congestive Heart Failure; Hypertension Past Medical History Notes: lymphedema Oncologic Immunizations Pneumococcal Vaccine: Received Pneumococcal Vaccination: No Implantable Devices None Hospitalization / Surgery History Type of Hospitalization/Surgery Tonsillectomy (pt. states at 77 yrs old) Family and Social History Cancer: Yes - Mother; Lung Disease: Yes - Father; Former smoker - quit 1994; Alcohol Use: Never; Drug Use: No History; Caffeine Use: Never; Financial Concerns: No; Food, Clothing or Shelter Needs: No; Support System Lacking: No; Transportation Concerns: No Electronic Signature(s) Signed: 10/28/2022 2:38:20 PM By: Geralyn Corwin Sandra Signed: 10/28/2022 4:51:53 PM By: Sandra [name], Sandra [name] Signed: 10/28/2022 5:02:42 PM By: Karie Schwalbe [name] Entered By: Karie Schwalbe on 10/28/2022 09:51:20 --------------------------------------------------------------------------------  SuperBill Details Patient Name: Date of Service: Sandra [name] 10/28/2022 Medical Record Number: [id number] Patient Account Number: [id number] Date of Birth/Sex: Treating [name]: 1946-04-01 (77 y.o. 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 I89.0 Lymphedema, not elsewhere classified T79.8XXA Other early complications of trauma, initial encounter I48.0 Paroxysmal atrial fibrillation Z79.01 Long term (current) use of anticoagulants Facility Procedures : Sandra [name], Sandra [name] Code: [phone] Sandra [name] [phone] Description: 11042 - DEB SUBQ TISSUE 20 SQ CM/< 97) [id number] Modifier: [id number] Quantity: 1 [id number] Page 8 of 8 : CPT4 Code: IC L I Description: D-10 Diagnosis Description 97.812 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 87.311 Chronic venous hypertension (idiopathic) with ulcer of right lower extremity Modifier: posed Quantity: : CPT4 Code: [id number] IC L I Description: 045 - DEB SUBQ TISS EA ADDL 20CM D-10 Diagnosis Description 97.812 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 87.311 Chronic venous hypertension (idiopathic) with ulcer of right lower extremity Modifier: posed Quantity: 5 Physician Procedures : CPT4 Code Description Modifier [id number] 99204 - WC PHYS LEVEL 4 - NEW PT ICD-10 Diagnosis Description 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 L97.812 Non-pressure chronic ulcer of other part of right lower  leg with fat layer exposed T79.8XXA Other early complications of trauma, initial encounter I89.0 Lymphedema, not elsewhere classified Quantity: 1 : [id number] 11042 - WC PHYS SUBQ TISS 20 SQ CM ICD-10 Diagnosis Description L97.812 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posed I87.311 Chronic  venous hypertension (idiopathic) with ulcer of right lower extremity Quantity: 1 : [id number] 11045 - WC PHYS SUBQ TISS EA ADDL 20 CM ICD-10 Diagnosis Description 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

## 2022-11-04 ENCOUNTER — Encounter (HOSPITAL_BASED_OUTPATIENT_CLINIC_OR_DEPARTMENT_OTHER): Payer: Medicare Other | Attending: Internal Medicine | Admitting: Internal Medicine

## 2022-11-04 DIAGNOSIS — I5032 Chronic diastolic (congestive) heart failure: Secondary | ICD-10-CM | POA: Insufficient documentation

## 2022-11-04 DIAGNOSIS — L97812 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posed: Secondary | ICD-10-CM | POA: Insufficient documentation

## 2022-11-04 DIAGNOSIS — X58XXXA Exposure to other specified factors, initial encounter: Secondary | ICD-10-CM | POA: Insufficient documentation

## 2022-11-04 DIAGNOSIS — I87311 Chronic venous hypertension (idiopathic) with ulcer of right lower extremity: Secondary | ICD-10-CM | POA: Diagnosis not present

## 2022-11-04 DIAGNOSIS — I890 Lymphedema, not elsewhere classified: Secondary | ICD-10-CM | POA: Diagnosis not present

## 2022-11-04 DIAGNOSIS — I110 Hypertensive heart disease with heart failure: Secondary | ICD-10-CM | POA: Diagnosis not present

## 2022-11-04 DIAGNOSIS — I480 Paroxysmal atrial fibrillation: Secondary | ICD-10-CM | POA: Insufficient documentation

## 2022-11-04 DIAGNOSIS — Z7901 Long term (current) use of anticoagulants: Secondary | ICD-10-CM | POA: Diagnosis not present

## 2022-11-04 DIAGNOSIS — Z87891 Personal history of nicotine dependence: Secondary | ICD-10-CM | POA: Diagnosis not present

## 2022-11-04 DIAGNOSIS — T798XXA Other early complications of trauma, initial encounter: Secondary | ICD-10-CM | POA: Insufficient documentation

## 2022-11-05 NOTE — Progress Notes (Signed)
[name], [name] ([id number]) [id number] Page 1 of 7 Visit Report for 11/04/2022 Arrival Information Details Patient Name: Date of Service: Sandra Mo, DO [place] B. 11/04/2022 9:30 A M Medical Record Number: [id number] Patient Account Number: [id number] Date of Birth/Sex: Treating RN: [birth year] (77 y.o. F) Primary Care Jule Whitsel: PA Zenovia Jordan, NO Other Clinician: Referring Jacquelyn Antony: Treating Halen Antenucci/Extender: Junious Silk in Treatment: 1 Visit Information History Since Last Visit Added or deleted any medications: No Patient Arrived: Walker Any new allergies or adverse reactions: No Arrival Time: 09:27 Had a fall or experienced change in No Accompanied By: self activities of daily living that may affect Transfer Assistance: None risk of falls: Patient Identification Verified: Yes Signs or symptoms of abuse/neglect since last visito No Secondary Verification Process Completed: Yes Hospitalized since last visit: No Patient Has Alerts: Yes Has Dressing in Place as Prescribed: Yes Patient Alerts: Patient on Blood Thinner Has Compression in Place as Prescribed: Yes Eliquis Pain Present Now: No Lasix Electronic Signature(s) Signed: 11/04/2022 3:54:44 PM By: Thayer Dallas Entered By: Thayer Dallas on 11/04/2022 09:28:19 -------------------------------------------------------------------------------- Compression Therapy Details Patient Name: Date of Service: Sandra Mo, DO RO [name] 11/04/2022 9:30 A M Medical Record Number: [id number] Patient Account Number: [id number] Date of Birth/Sex: Treating RN: February 24, 1946 (77 y.o. Sandra [name] Primary Care Sergei Delo: PA Zenovia Jordan, [place] Other Clinician: Referring Sullivan Jacuinde: Treating Stpehanie Montroy/Extender: Junious Silk in Treatment: 1 Compression Therapy Performed for Wound Assessment: Wound #1 Right,Lateral Lower Leg Performed By: Clinician Karie Schwalbe, RN Compression Type:  Three Layer Post Procedure Diagnosis Same as Pre-procedure Notes UNNA at the top of the leg. Or use URGO K2 Lite Electronic Signature(s) Signed: 11/04/2022 4:02:29 PM By: Karie Schwalbe RN Entered By: Karie Schwalbe on 11/04/2022 10:48:09 -------------------------------------------------------------------------------- Encounter Discharge Information Details Patient Name: Date of Service: Sandra Mo, DO RO [name] 11/04/2022 9:30 A M Medical Record Number: [id number] Patient Account Number: [id number] Date of Birth/Sex: Treating RN: 12-Aug-1945 (77 y.o. Sandra [name] Primary Care Evalena Fujii: PA Zenovia Jordan, [place] Other Clinician: Referring Alija Riano: Treating Nahome Bublitz/Extender: Junious Silk in Treatment: 1 Encounter Discharge Information Items Discharge Condition: Stable Ambulatory Status: [facility] Discharge Destination: Home Transportation: [address] [place], [place] B ([id number]) [id number] Page 2 of 7 Accompanied By: self Schedule Follow-up Appointment: Yes Clinical Summary of Care: Patient Declined Electronic Signature(s) Signed: 11/04/2022 4:02:29 PM By: Karie Schwalbe RN Entered By: Karie Schwalbe on 11/04/2022 10:50:24 -------------------------------------------------------------------------------- Lower Extremity Assessment Details Patient Name: Date of Service: Sandra Mo, DO RO [name] 11/04/2022 9:30 A M Medical Record Number: [id number] Patient Account Number: [id number] Date of Birth/Sex: Treating RN: 24-Aug-1945 (77 y.o. Sandra [name] Primary Care Kiarra Kidd: PA Zenovia Jordan, [place] Other Clinician: Referring Lizanne Erker: Treating Kawhi Diebold/Extender: Junious Silk in Treatment: 1 Edema Assessment Assessed: [Left: No] [Right: No] [Left: Edema] [Right: :] Calf Left: Right: Point of Measurement: 35 cm From Medial Instep 49.3 cm Ankle Left: Right: Point of Measurement: 11 cm From Medial Instep 39.5 cm Knee To  Floor Left: Right: From Medial Instep 41 cm Vascular Assessment Pulses: Dorsalis Pedis Palpable: [Right:Yes] Electronic Signature(s) Signed: 11/04/2022 4:02:29 PM By: Karie Schwalbe RN Entered By: Karie Schwalbe on 11/04/2022 10:11:01 -------------------------------------------------------------------------------- Multi Wound Chart Details Patient Name: Date of Service: Sandra Mo, DO RO [name] 11/04/2022 9:30 A M Medical Record Number: [id number] Patient Account Number: [id number] Date of Birth/Sex: Treating RN: 1945-12-26 (77 y.o. F) Primary Care Afsana Liera: PA Zenovia Jordan, [place] Other Clinician: Referring Elba Dendinger: Treating Harm Jou/Extender: Adria Devon  Weeks in Treatment: 1 Vital Signs Height(in): 63 Pulse(bpm): 64 Weight(lbs): 285 Blood Pressure(mmHg): 133/78 Body Mass Index(BMI): 50.5 Temperature(F): 97.7 Respiratory Rate(breaths/min): 18 [1:Photos:] [N/A:N/A] Right, Lateral Lower Leg N/A N/A Wound Location: Gradually Appeared N/A N/A Wounding Event: Lymphedema N/A N/A Primary Etiology: Arrhythmia, Congestive Heart Failure, N/A N/A Comorbid History: Hypertension 09/09/2022 N/A N/A Date Acquired: 1 N/A N/A Weeks of Treatment: Open N/A N/A Wound Status: No N/A N/A Wound Recurrence: 1x1x0.1 N/A N/A Measurements L x W x D (cm) 0.785 N/A N/A A (cm) : rea 0.079 N/A N/A Volume (cm) : 99.30% N/A N/A % Reduction in Area: 99.30% N/A N/A % Reduction in Volume: Full Thickness Without Exposed N/A N/A Classification: Support Structures Large N/A N/A Exudate A mount: Serosanguineous N/A N/A Exudate Type: red, brown N/A N/A Exudate Color: Distinct, outline attached N/A N/A Wound Margin: Medium (34-66%) N/A N/A Granulation Amount: Pink, Pale N/A N/A Granulation Quality: Medium (34-66%) N/A N/A Necrotic Amount: Eschar, Adherent Slough N/A N/A Necrotic Tissue: Fat Layer (Subcutaneous Tissue): Yes N/A N/A Exposed Structures: Fascia: No Tendon:  No Muscle: No Joint: No Bone: No None N/A N/A Epithelialization: Excoriation: No N/A N/A Periwound Skin Texture: Induration: No Callus: No Crepitus: No Rash: No Scarring: No Dry/Scaly: Yes N/A N/A Periwound Skin Moisture: Maceration: No Hemosiderin Staining: Yes N/A N/A Periwound Skin Color: Atrophie Blanche: No Cyanosis: No Ecchymosis: No Erythema: No Mottled: No Pallor: No Rubor: No No Abnormality N/A N/A Temperature: Compression Therapy N/A N/A Procedures Performed: Treatment Notes Wound #1 (Lower Leg) Wound Laterality: Right, Lateral Cleanser Soap and Water Discharge Instruction: May shower and wash wound with dial antibacterial soap and water prior to dressing change. Vashe 5.8 (oz) Discharge Instruction: Cleanse the wound with Vashe prior to applying a clean dressing using gauze sponges, not tissue or cotton balls. Peri-Wound Care Sween Lotion (Moisturizing lotion) Discharge Instruction: Apply moisturizing lotion as directed Topical Gentamicin Discharge Instruction: in clinic only Mupirocin Ointment Discharge Instruction: in clinic only Primary Dressing Maxorb Extra Ag+ Alginate Dressing, 4x4.75 (in/in) Discharge Instruction: Apply to wound bed as instructed TSUYUKO, MADURA ([id number]) [id number] Page 4 of 7 Secondary Dressing ABD Pad, 8x10 Discharge Instruction: Apply over primary dressing as directed. Woven Gauze Sponge, Non-Sterile 4x4 in Discharge Instruction: Apply over primary dressing as directed. Secured With Compression Wrap ThreePress (3 layer compression wrap) Discharge Instruction: Or Apply three layer compression as directed. Urgo K2 Lite, two layer compression system, regular Discharge Instruction: Apply Urgo K2 Lite as directed (alternative to 3 layer compression). Unnaboot w/Calamine, 4x10 (in/yd) Discharge Instruction: Apply Unnaboot at top of the leg Stockinette Compression  Stockings Add-Ons Electronic Signature(s) Signed: 11/04/2022 11:49:34 AM By: Geralyn Corwin DO Entered By: Geralyn Corwin on 11/04/2022 11:26:22 -------------------------------------------------------------------------------- Multi-Disciplinary Care Plan Details Patient Name: Date of Service: Sandra Mo, DO RO [name] 11/04/2022 9:30 A M Medical Record Number: [id number] Patient Account Number: [id number] Date of Birth/Sex: Treating RN: June 10, 1946 (77 y.o. Sandra [name] Primary Care Ivry Pigue: PA Zenovia Jordan, [place] Other Clinician: Referring Ayde Record: Treating Dmitri Pettigrew/Extender: Junious Silk in Treatment: 1 Active Inactive Wound/Skin Impairment Nursing Diagnoses: Impaired tissue integrity Goals: Patient/caregiver will verbalize understanding of skin care regimen Date Initiated: 10/28/2022 Target Resolution Date: 01/02/2023 Goal Status: Active Interventions: Assess ulceration(s) every visit Treatment Activities: Skin care regimen initiated : 10/28/2022 Notes: Electronic Signature(s) Signed: 11/04/2022 4:02:29 PM By: Karie Schwalbe RN Entered By: Karie Schwalbe on 11/04/2022 10:49:19 -------------------------------------------------------------------------------- Pain Assessment Details Patient Name: Date of Service: Sandra Mo, DO RO THY B. 11/04/2022 9:30 A M  Medical Record Number: [id number] Patient Account Number: [id number] Date of Birth/Sex: Treating RN: 30-Dec-1945 (77 y.o. F) Primary Care Berle Fitz: PA Zenovia Jordan, [place] Other Clinician: Referring Yuval Nolet: Treating Aristotelis Vilardi/Extender: Maurica, Belz, Willis Modena ([id number]) [id number] Page 5 of 7 Weeks in Treatment: 1 Active Problems Location of Pain Severity and Description of Pain Patient Has Paino No Site Locations Pain Management and Medication Current Pain Management: Electronic Signature(s) Signed: 11/04/2022 3:54:44 PM By: Thayer Dallas Entered By: Thayer Dallas on 11/04/2022 09:30:58 -------------------------------------------------------------------------------- Patient/Caregiver Education Details Patient Name: Date of Service: Sandra Mo, DO [place] 5/2/2024andnbsp9:30 A M Medical Record Number: [id number] Patient Account Number: [id number] Date of Birth/Gender: Treating RN: 12/25/1945 (77 y.o. Sandra [name] Primary Care Physician: PA Zenovia Jordan, [place] Other Clinician: Referring Physician: Treating Physician/Extender: Junious Silk in Treatment: 1 Education Assessment Education Provided To: Patient Education Topics Provided Wound/Skin Impairment: Methods: Explain/Verbal Responses: Return demonstration correctly Electronic Signature(s) Signed: 11/04/2022 4:02:29 PM By: Karie Schwalbe RN Entered By: Karie Schwalbe on 11/04/2022 10:49:32 -------------------------------------------------------------------------------- Wound Assessment Details Patient Name: Date of Service: Sandra Mo, DO RO [name] 11/04/2022 9:30 A M Medical Record Number: [id number] Patient Account Number: [id number] Date of Birth/Sex: Treating RN: 10-Apr-1946 (77 y.o. Sandra [name] Primary Care Aryanne Gilleland: PA Zenovia Jordan, [place] Other Clinician: Referring Taiven Greenley: Treating Jeanelle Dake/Extender: Junious Silk in Treatment: 1 [place], Willis Modena ([id number]) [id number] Page 6 of 7 Wound Status Wound Number: 1 Primary Etiology: Lymphedema Wound Location: Right, Lateral Lower Leg Wound Status: Open Wounding Event: Gradually Appeared Comorbid History: Arrhythmia, Congestive Heart Failure, Hypertension Date Acquired: 09/09/2022 Weeks Of Treatment: 1 Clustered Wound: No Photos Wound Measurements Length: (cm) 1 Width: (cm) 1 Depth: (cm) 0.1 Area: (cm) 0.785 Volume: (cm) 0.079 % Reduction in Area: 99.3% % Reduction in Volume: 99.3% Epithelialization: None Tunneling: No Undermining: No Wound  Description Classification: Full Thickness Without Exposed Support Structures Wound Margin: Distinct, outline attached Exudate Amount: Large Exudate Type: Serosanguineous Exudate Color: red, brown Foul Odor After Cleansing: No Slough/Fibrino Yes Wound Bed Granulation Amount: Medium (34-66%) Exposed Structure Granulation Quality: Pink, Pale Fascia Exposed: No Necrotic Amount: Medium (34-66%) Fat Layer (Subcutaneous Tissue) Exposed: Yes Necrotic Quality: Eschar, Adherent Slough Tendon Exposed: No Muscle Exposed: No Joint Exposed: No Bone Exposed: No Periwound Skin Texture Texture Color No Abnormalities Noted: No No Abnormalities Noted: No Callus: No Atrophie Blanche: No Crepitus: No Cyanosis: No Excoriation: No Ecchymosis: No Induration: No Erythema: No Rash: No Hemosiderin Staining: Yes Scarring: No Mottled: No Pallor: No Moisture Rubor: No No Abnormalities Noted: No Dry / Scaly: Yes Temperature / Pain Maceration: No Temperature: No Abnormality Treatment Notes Wound #1 (Lower Leg) Wound Laterality: Right, Lateral Cleanser Soap and Water Discharge Instruction: May shower and wash wound with dial antibacterial soap and water prior to dressing change. Vashe 5.8 (oz) Discharge Instruction: Cleanse the wound with Vashe prior to applying a clean dressing using gauze sponges, not tissue or cotton balls. Peri-Wound Care Sween Lotion (Moisturizing lotion) Discharge Instruction: Apply moisturizing lotion as directed JACQUA, STEERE ([id number]) [phone].pdf Page 7 of 7 Topical Gentamicin Discharge Instruction: in clinic only Mupirocin Ointment Discharge Instruction: in clinic only Primary Dressing Maxorb Extra Ag+ Alginate Dressing, 4x4.75 (in/in) Discharge Instruction: Apply to wound bed as instructed Secondary Dressing ABD Pad, 8x10 Discharge Instruction: Apply over primary dressing as directed. Woven Gauze Sponge, Non-Sterile 4x4  in Discharge Instruction: Apply over primary dressing as directed. Secured With Compression Wrap ThreePress (3 layer compression wrap) Discharge Instruction: Or Apply three  layer compression as directed. Urgo K2 Lite, two layer compression system, regular Discharge Instruction: Apply Urgo K2 Lite as directed (alternative to 3 layer compression). Unnaboot w/Calamine, 4x10 (in/yd) Discharge Instruction: Apply Unnaboot at top of the leg Stockinette Compression Stockings Add-Ons Electronic Signature(s) Signed: 11/04/2022 4:02:29 PM By: Karie Schwalbe RN Entered By: Karie Schwalbe on 11/04/2022 09:50:28 -------------------------------------------------------------------------------- Vitals Details Patient Name: Date of Service: Sandra Mo, DO RO THY B. 11/04/2022 9:30 A M Medical Record Number: [id number] Patient Account Number: [id number] Date of Birth/Sex: Treating RN: 19-Nov-1945 (77 y.o. F) Primary Care Deloria Brassfield: PA [place], NO Other Clinician: Referring Mishelle Hassan: Treating Oleta Gunnoe/Extender: Junious Silk in Treatment: 1 Vital Signs Time Taken: 09:28 Temperature (F): 97.7 Height (in): 63 Pulse (bpm): 64 Weight (lbs): 285 Respiratory Rate (breaths/min): 18 Body Mass Index (BMI): 50.5 Blood Pressure (mmHg): 133/78 Reference Range: 80 - 120 mg / dl Electronic Signature(s) Signed: 11/04/2022 3:54:44 PM By: Thayer Dallas Entered By: Thayer Dallas on 11/04/2022 09:30:49

## 2022-11-05 NOTE — Progress Notes (Signed)
Sandra, [name] ([id number]) [id number] Page 1 of 7 Visit Report for 11/04/2022 Chief Complaint Document Details Patient Name: Date of Service: Sandra [name], [place] [name]. 11/04/2022 9:30 A M Medical Record Number: [id number] Patient Account Number: [id number] Date of Birth/Sex: Treating RN: 08/05/1945 (77 y.o. F) Primary Care Provider: PA Sandra [name], NO Other Clinician: Referring Provider: Treating Provider/Extender: Junious Silk in Treatment: 1 Information Obtained from: Patient Chief Complaint 10/28/2022; right distal lower extremity wound Electronic Signature(s) Signed: 11/04/2022 11:49:34 AM By: Geralyn Corwin DO Entered By: Geralyn Corwin on 11/04/2022 11:26:44 -------------------------------------------------------------------------------- HPI Details Patient Name: Date of Service: Sandra Mo, DO [name]. 11/04/2022 9:30 A M Medical Record Number: [id number] Patient Account Number: [id number] Date of Birth/Sex: Treating RN: 1945/07/13 (77 y.o. F) Primary Care Provider: PA Sandra [name], NO Other Clinician: Referring Provider: Treating Provider/Extender: Junious Silk in Treatment: 1 History of Present Illness HPI Description: 10/28/2022 Ms. Sandra [name] is a 77 year old female with a past medical history of of diastolic heart failure, paroxysmal A-fib on Eliquis, and lymphedema that presents to the clinic for a 1-82-month history of nonhealing ulcer to the right lower extremity. She states she hit her leg against a wheelchair and it created a wound. Over the past 1 to 2 months the area has gotten larger. She states she has been on all amoxicillin per her PCP for this issue. She is not currently on antibiotics And denies systemic signs of infection. She has been using silver alginate to the wound bed. She does not wear compression stockings. 5/2; patient presents for follow-up. We have been using antibiotic ointment with  silver alginate under 3 layer compression to the right lower extremity. The wound is almost healed. We discussed compression stockings to have after her wounds healed. We will send orders for home health to order these. Electronic Signature(s) Signed: 11/04/2022 11:49:34 AM By: Geralyn Corwin DO Entered By: Geralyn Corwin on 11/04/2022 11:27:33 -------------------------------------------------------------------------------- Physical Exam Details Patient Name: Date of Service: Sandra Mo, DO RO Levonne Lapping [name]. 11/04/2022 9:30 A M Medical Record Number: [id number] Patient Account Number: [id number] Date of Birth/Sex: Treating RN: July 25, 1945 (77 y.o. F) Primary Care Provider: PA Sandra [name], NO Other Clinician: Referring Provider: Treating Provider/Extender: Junious Silk in Treatment: 1 Constitutional respirations regular, non-labored and within target range for patient.. Cardiovascular 2+ dorsalis pedis/posterior tibialis pulses. Psychiatric pleasant and cooperative. LUNA, WANKO ([id number]) [id number] Page 2 of 7 Notes Right lower extremity: T the distal aspect near the ankle there is Is an open wound with mostly epithelization and some granulation tissue present.. 2+ pitting o edema to the knee. Stage III lymphedema. No surrounding signs of infection including increased warmth, erythema or purulent drainage. Electronic Signature(s) Signed: 11/04/2022 11:49:34 AM By: Geralyn Corwin DO Entered By: Geralyn Corwin on 11/04/2022 11:28:25 -------------------------------------------------------------------------------- Physician Orders Details Patient Name: Date of Service: Sandra Mo, DO [name]. 11/04/2022 9:30 A M Medical Record Number: [id number] Patient Account Number: [id number] Date of Birth/Sex: Treating RN: 1946-03-26 (77 y.o. [name] Primary Care Provider: PA Sandra [name], [place] Other Clinician: Referring Provider: Treating  Provider/Extender: Junious Silk in Treatment: 1 Verbal / Phone Orders: No Diagnosis Coding Follow-up Appointments ppointment in 1 week. - Dr. Mikey Bussing Room 9 Return A Anesthetic (In clinic) Topical Lidocaine 4% applied to wound bed Bathing/ Shower/ Hygiene May shower with protection but do not get wound dressing(s) wet. Protect dressing(s) with water repellant cover (for example, large plastic bag) or a cast  cover and may then take shower. - Do not get Right leg wraps wet. Use a cast protector. Can purchase from [facility], [facility],[employer] etc. Cost is approximately $20-$35. Other Bathing/Shower/Hygiene Orders/Instructions: - May do a sponge bath. Edema Control - Lymphedema / SCD / Other Bilateral Lower Extremities Elevate legs to the level of the heart or above for 30 minutes daily and/or when sitting for 3-4 times a day throughout the day. Avoid standing for long periods of time. Exercise regularly - As T olerated Moisturize legs daily. - Left leg only until Right leg is healed, then put moisturizer on right leg when right leg is healed. Compression stocking or Garment 30-40 mm/Hg pressure to: - Juxtalite compression garment 30-40 mmHg Off-Loading Other: - When sitting-please put legs up! (elevate legs to heart level or above heart level while sitting) Home Health Wound #1 Right,Lateral Lower Leg Other Home Health Orders/Instructions: - [facility] please change Right leg wound dressing on Mondays. pt. will come to the wound clinic on Thursday. Please order compression garment, Juxtalite 30-40 mmHg. Thank you Wound Treatment Wound #1 - Lower Leg Wound Laterality: Right, Lateral Cleanser: Soap and Water 2 x Per Week/30 Days Discharge Instructions: May shower and wash wound with dial antibacterial soap and water prior to dressing change. Cleanser: Vashe 5.8 (oz) 2 x Per Week/30 Days Discharge Instructions: Cleanse the wound with Vashe prior to applying a  clean dressing using gauze sponges, not tissue or cotton balls. Peri-Wound Care: Sween Lotion (Moisturizing lotion) 2 x Per Week/30 Days Discharge Instructions: Apply moisturizing lotion as directed Topical: Gentamicin 2 x Per Week/30 Days Discharge Instructions: in clinic only Topical: Mupirocin Ointment 2 x Per Week/30 Days Discharge Instructions: in clinic only Prim Dressing: Maxorb Extra Ag+ Alginate Dressing, 4x4.75 (in/in) 2 x Per Week/30 Days ary Discharge Instructions: Apply to wound bed as instructed Secondary Dressing: ABD Pad, 8x10 2 x Per Week/30 Days Discharge Instructions: Apply over primary dressing as directed. Secondary Dressing: Woven Gauze Sponge, Non-Sterile 4x4 in 2 x Per Week/30 Days Discharge Instructions: Apply over primary dressing as directed. SCOTT, MALLICOAT ([id number]) [id number] Page 3 of 7 Compression Wrap: ThreePress (3 layer compression wrap) 2 x Per Week/30 Days Discharge Instructions: Or Apply three layer compression as directed. Compression Wrap: Urgo K2 Lite, two layer compression system, regular 2 x Per Week/30 Days Discharge Instructions: Apply Urgo K2 Lite as directed (alternative to 3 layer compression). Compression Wrap: Unnaboot w/Calamine, 4x10 (in/yd) 2 x Per Week/30 Days Discharge Instructions: Apply Unnaboot at top of the leg Compression Wrap: Stockinette 2 x Per Week/30 Days Compression Stockings: Circaid Juxta Lite Compression Wrap (Home Health) Right Leg Compression Amount: 30-40 mmHG Discharge Instructions: Apply Circaid Juxta Lite Compression Wrap daily as instructed. Apply first thing in the morning, remove at night before bed. Electronic Signature(s) Signed: 11/04/2022 3:22:05 PM By: Geralyn Corwin DO Signed: 11/04/2022 4:02:29 PM By: Karie Schwalbe RN Previous Signature: 11/04/2022 11:49:34 AM Version By: Geralyn Corwin DO Entered By: Karie Schwalbe on 11/04/2022  11:51:20 -------------------------------------------------------------------------------- Problem List Details Patient Name: Date of Service: Sandra Mo, DO [name]. 11/04/2022 9:30 A M Medical Record Number: [id number] Patient Account Number: [id number] Date of Birth/Sex: Treating RN: 12-23-1945 (77 y.o. F) Primary Care Provider: PA Sandra [name], NO Other Clinician: Referring Provider: Treating Provider/Extender: Junious Silk in Treatment: 1 Active Problems ICD-10 Encounter Code Description Active Date MDM Diagnosis L97.812 Non-pressure chronic ulcer of other part of right lower leg with fat layer 10/28/2022 No Yes exposed I87.311  Chronic venous hypertension (idiopathic) with ulcer of right lower extremity 10/28/2022 No Yes I89.0 Lymphedema, not elsewhere classified 10/28/2022 No Yes T79.8XXA Other early complications of trauma, initial encounter 10/28/2022 No Yes I48.0 Paroxysmal atrial fibrillation 10/28/2022 No Yes Z79.01 Long term (current) use of anticoagulants 10/28/2022 No Yes Inactive Problems Resolved Problems Electronic Signature(s) Signed: 11/04/2022 11:49:34 AM By: [place] Lions, Willis Modena ([id number]) [id number] Page 4 of 7 Entered By: Geralyn Corwin on 11/04/2022 11:26:18 -------------------------------------------------------------------------------- Progress Note Details Patient Name: Date of Service: Sandra [name] 11/04/2022 9:30 A M Medical Record Number: [id number] Patient Account Number: [id number] Date of Birth/Sex: Treating RN: August 05, 1945 (77 y.o. F) Primary Care Provider: PA Sandra [name], NO Other Clinician: Referring Provider: Treating Provider/Extender: Junious Silk in Treatment: 1 Subjective Chief Complaint Information obtained from Patient 10/28/2022; right distal lower extremity wound History of Present Illness (HPI) 10/28/2022 Ms. Sandra [name] is a 77 year old female  with a past medical history of of diastolic heart failure, paroxysmal A-fib on Eliquis, and lymphedema that presents to the clinic for a 1-20-month history of nonhealing ulcer to the right lower extremity. She states she hit her leg against a wheelchair and it created a wound. Over the past 1 to 2 months the area has gotten larger. She states she has been on all amoxicillin per her PCP for this issue. She is not currently on antibiotics And denies systemic signs of infection. She has been using silver alginate to the wound bed. She does not wear compression stockings. 5/2; patient presents for follow-up. We have been using antibiotic ointment with silver alginate under 3 layer compression to the right lower extremity. The wound is almost healed. We discussed compression stockings to have after her wounds healed. We will send orders for home health to order these. Patient History Information obtained from Patient, Chart. Family History Cancer - Mother, Lung Disease - Father. Social History Former smoker - quit 1994, Alcohol Use - Never, Drug Use - No History, Caffeine Use - Never. Medical History Cardiovascular Patient has history of Arrhythmia - A. fib, Congestive Heart Failure, Hypertension Hospitalization/Surgery History - Tonsillectomy (pt. states at 77 yrs old). Medical A Surgical History Notes nd Cardiovascular lymphedema Objective Constitutional respirations regular, non-labored and within target range for patient.. Vitals Time Taken: 9:28 AM, Height: 63 in, Weight: 285 lbs, BMI: 50.5, Temperature: 97.7 F, Pulse: 64 bpm, Respiratory Rate: 18 breaths/min, Blood Pressure: 133/78 mmHg. Cardiovascular 2+ dorsalis pedis/posterior tibialis pulses. Psychiatric pleasant and cooperative. General Notes: Right lower extremity: T the distal aspect near the ankle there is Is an open wound with mostly epithelization and some granulation tissue o present.. 2+ pitting edema to the knee. Stage  III lymphedema. No surrounding signs of infection including increased warmth, erythema or purulent drainage. Integumentary (Hair, Skin) Wound #1 status is Open. Original cause of wound was Gradually Appeared. The date acquired was: 09/09/2022. The wound has been in treatment 1 weeks. The wound is located on the Right,Lateral Lower Leg. The wound measures 1cm length x 1cm width x 0.1cm depth; 0.785cm^2 area and 0.079cm^3 volume. There is Fat Layer (Subcutaneous Tissue) exposed. There is no tunneling or undermining noted. There is a large amount of serosanguineous drainage noted. The wound margin is distinct with the outline attached to the wound base. There is medium (34-66%) pink, pale granulation within the wound bed. There is a medium (34- 66%) amount of necrotic tissue within the wound bed including Eschar and Adherent Slough. The periwound skin appearance  exhibited: Dry/Scaly, Hemosiderin Staining. The periwound skin appearance did not exhibit: Callus, Crepitus, Excoriation, Induration, Rash, Scarring, Maceration, Atrophie Sandra [name] Cyanosis, Sandra [name], Sandra [name] ([id number]) [id number] Page 5 of 7 Ecchymosis, Mottled, Pallor, Rubor, Erythema. Periwound temperature was noted as No Abnormality. 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 It is almost healed. I recommended continuing this course. We discussed compression stockings to use once her wounds heal . Will send orders to home health to order these. Follow-up in 1 week. Procedures Wound  #1 Pre-procedure diagnosis of Wound #1 is a Lymphedema located on the Right,Lateral Lower Leg . There was a Three Layer Compression Therapy Procedure by Karie Schwalbe, RN. Post procedure Diagnosis Wound #1: Same as Pre-Procedure Notes: UNNA at the top of the leg. Or use URGO K2 Lite. Plan Follow-up Appointments: Return Appointment in 1 week. - Dr. Mikey Bussing Room 9 Anesthetic: (In clinic) Topical Lidocaine 4% applied to wound bed Bathing/ Shower/ Hygiene: May shower with protection but do not get wound dressing(s) wet. Protect dressing(s) with water repellant cover (for example, large plastic bag) or a cast cover and may then take shower. - Do not get Right leg wraps wet. Use a cast protector. Can purchase from [facility], [facility] etc. Cost is approximately $20-$35. Other Bathing/Shower/Hygiene Orders/Instructions: - May do a sponge bath. Edema Control - Lymphedema / SCD / Other: Elevate legs to the level of the heart or above for 30 minutes daily and/or when sitting for 3-4 times a day throughout the day. Avoid standing for long periods of time. Exercise regularly - As T olerated Moisturize legs daily. - Left leg only until Right leg is healed, then put moisturizer on right leg when right leg is healed. Compression stocking or Garment 30-40 mm/Hg pressure to: - Juxtalite compression garment 30-40 mmHg Off-Loading: Other: - When sitting-please put legs up! (elevate legs to heart level or above heart level while sitting) Home Health: Wound #1 Right,Lateral Lower Leg: Other Home Health Orders/Instructions: - [facility] please change Right leg wound dressing on Mondays. pt. will come to the wound clinic on Thursday. Please order compression garment, Juxtalite 30-40 mmHg. Thank you WOUND #1: - Lower Leg Wound Laterality: Right, Lateral Cleanser: Soap and Water 2 x Per Week/30 Days Discharge Instructions: May shower and wash wound with dial antibacterial soap and water prior to  dressing change. Cleanser: Vashe 5.8 (oz) 2 x Per Week/30 Days Discharge Instructions: Cleanse the wound with Vashe prior to applying a clean dressing using gauze sponges, not tissue or cotton balls. Peri-Wound Care: Sween Lotion (Moisturizing lotion) 2 x Per Week/30 Days Discharge Instructions: Apply moisturizing lotion as directed Topical: Gentamicin 2 x Per Week/30 Days Discharge Instructions: in clinic only Topical: Mupirocin Ointment 2 x Per Week/30 Days Discharge Instructions: in clinic only Prim Dressing: Maxorb Extra Ag+ Alginate Dressing, 4x4.75 (in/in) 2 x Per Week/30 Days ary Discharge Instructions: Apply to wound bed as instructed Secondary Dressing: ABD Pad, 8x10 2 x Per Week/30 Days Discharge Instructions: Apply over primary dressing as directed. Secondary Dressing: Woven Gauze Sponge, Non-Sterile 4x4 in 2 x Per Week/30 Days Discharge Instructions: Apply over primary dressing as directed. Com pression Wrap: ThreePress (3 layer compression wrap) 2  x Per Week/30 Days Discharge Instructions: Or Apply three layer compression as directed. Com pression Wrap: Urgo K2 Lite, two layer compression system, regular 2 x Per Week/30 Days Discharge Instructions: Apply Urgo K2 Lite as directed (alternative to 3 layer compression). Com pression Wrap: Unnaboot w/Calamine, 4x10 (in/yd) 2 x Per Week/30 Days Discharge Instructions: Apply Unnaboot at top of the leg Com pression Wrap: Stockinette 2 x Per Week/30 Days Sandra, [name] ([id number]) [phone].pdf Page 6 of 7 1. Silver alginate with antibiotic ointment under 3 layer compression to the right lower extremity 2. Follow-up in 1 week 3. Send orders to home health to order compression garments Electronic Signature(s) Signed: 11/04/2022 11:49:34 AM By: Geralyn Corwin DO Entered By: Geralyn Corwin on 11/04/2022 11:29:49 -------------------------------------------------------------------------------- HxROS  Details Patient Name: Date of Service: Sandra Mo, DO [name]. 11/04/2022 9:30 A M Medical Record Number: [id number] Patient Account Number: [id number] Date of Birth/Sex: Treating RN: Nov 18, 1945 (77 y.o. F) Primary Care Provider: PA Sandra [name], NO Other Clinician: Referring Provider: Treating Provider/Extender: Junious Silk in Treatment: 1 Information Obtained From Patient Chart Cardiovascular Medical History: Positive for: Arrhythmia - A. fib; Congestive Heart Failure; Hypertension Past Medical History Notes: lymphedema Immunizations Pneumococcal Vaccine: Received Pneumococcal Vaccination: No Implantable Devices None Hospitalization / Surgery History Type of Hospitalization/Surgery Tonsillectomy (pt. states at 77 yrs old) Family and Social History Cancer: Yes - Mother; Lung Disease: Yes - Father; Former smoker - quit 1994; Alcohol Use: Never; Drug Use: No History; Caffeine Use: Never; Financial Concerns: No; Food, Clothing or Shelter Needs: No; Support System Lacking: No; Transportation Concerns: No Electronic Signature(s) Signed: 11/04/2022 11:49:34 AM By: Geralyn Corwin DO Entered By: Geralyn Corwin on 11/04/2022 11:27:37 -------------------------------------------------------------------------------- SuperBill Details Patient Name: Date of Service: Sandra Mo, DO RO Levonne Lapping [name]. 11/04/2022 Medical Record Number: [id number] Patient Account Number: [id number] Date of Birth/Sex: Treating RN: May 15, 1946 (77 y.o. [name] Primary Care Provider: PA Sandra [name], [place] Other Clinician: Referring Provider: Treating Provider/Extender: Junious Silk in Treatment: 1 Diagnosis Coding ICD-10 Codes Code Description [phone] 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posed 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 I89.0 Lymphedema, not elsewhere classified T79.8XXA Other  early complications of trauma, initial encounter I48.0 Paroxysmal atrial fibrillation LYDIANA, BIEGLER [name] ([id number]) [id number].pdf Page 7 of 7 Z79.01 Long term (current) use of anticoagulants Facility Procedures : CPT4 Code: [id number] Description: (Facility Use Only) [id number] - APPLY MULTLAY COMPRS LWR RT LEG ICD-10 Diagnosis Description L97.812 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posed Modifier: Quantity: 1 Physician Procedures : CPT4 Code Description Modifier [id number] - WC PHYS LEVEL 3 - EST PT ICD-10 Diagnosis Description L97.812 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posed I87.311 Chronic venous hypertension (idiopathic) with ulcer  of right lower extremity I89.0 Lymphedema, not elsewhere classified T79.8XXA Other early complications of trauma, initial encounter Quantity: 1 Electronic Signature(s) Signed: 11/04/2022 11:49:34 AM By: Geralyn Corwin DO Entered By: Geralyn Corwin on 11/04/2022 11:30:02

## 2022-11-10 ENCOUNTER — Ambulatory Visit (HOSPITAL_BASED_OUTPATIENT_CLINIC_OR_DEPARTMENT_OTHER): Payer: Medicare Other | Admitting: General Surgery

## 2022-11-11 ENCOUNTER — Encounter (HOSPITAL_BASED_OUTPATIENT_CLINIC_OR_DEPARTMENT_OTHER): Payer: Medicare Other | Admitting: Internal Medicine

## 2022-11-11 DIAGNOSIS — I87311 Chronic venous hypertension (idiopathic) with ulcer of right lower extremity: Secondary | ICD-10-CM | POA: Diagnosis not present

## 2022-11-11 DIAGNOSIS — L97812 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posed: Secondary | ICD-10-CM | POA: Diagnosis not present

## 2022-11-11 DIAGNOSIS — I890 Lymphedema, not elsewhere classified: Secondary | ICD-10-CM | POA: Diagnosis not present

## 2022-11-11 DIAGNOSIS — T798XXA Other early complications of trauma, initial encounter: Secondary | ICD-10-CM | POA: Diagnosis not present

## 2022-11-11 NOTE — Progress Notes (Signed)
REHMAT, WEATHERFORD ([id number]) [id number] Page 1 of 5 Visit Report for 11/11/2022 Chief Complaint Document Details Patient Name: Date of Service: Sandra [name] 11/11/2022 3:30 PM Medical Record Number: [id number] Patient Account Number: [id number] Date of Birth/Sex: Treating RN: May 31, 1946 (77 y.o. F) Primary Care Provider: PA Zenovia Jordan, NO Other Clinician: Referring Provider: Treating Provider/Extender: Junious Silk in Treatment: 2 Information Obtained from: Patient Chief Complaint 10/28/2022; right distal lower extremity wound Electronic Signature(s) Signed: 11/11/2022 4:06:25 PM By: Geralyn Corwin DO Entered By: Geralyn Corwin on 11/11/2022 16:03:34 -------------------------------------------------------------------------------- HPI Details Patient Name: Date of Service: Sandra Mo, DO RO THY B. 11/11/2022 3:30 PM Medical Record Number: [id number] Patient Account Number: [id number] Date of Birth/Sex: Treating RN: 05-15-1946 (77 y.o. F) Primary Care Provider: PA Zenovia Jordan, NO Other Clinician: Referring Provider: Treating Provider/Extender: Junious Silk in Treatment: 2 History of Present Illness HPI Description: 10/28/2022 Ms. Sandra [name] is a 77 year old female with a past medical history of of diastolic heart failure, paroxysmal A-fib on Eliquis, and lymphedema that presents to the clinic for a 1-35-month history of nonhealing ulcer to the right lower extremity. She states she hit her leg against a wheelchair and it created a wound. Over the past 1 to 2 months the area has gotten larger. She states she has been on all amoxicillin per her PCP for this issue. She is not currently on antibiotics And denies systemic signs of infection. She has been using silver alginate to the wound bed. She does not wear compression stockings. 5/2; patient presents for follow-up. We have been using antibiotic ointment with  silver alginate under 3 layer compression to the right lower extremity. The wound is almost healed. We discussed compression stockings to have after her wounds healed. We will send orders for home health to order these. 5/9; patient presents for follow-up. We have been using antibiotic ointment with silver alginate under 3 layer compression to the right lower extremity. Her wound is healed. She states she has juxta lite compression wraps at home. Electronic Signature(s) Signed: 11/11/2022 4:06:25 PM By: Geralyn Corwin DO Entered By: Geralyn Corwin on 11/11/2022 16:04:14 -------------------------------------------------------------------------------- Physical Exam Details Patient Name: Date of Service: Sandra Mo, DO RO [name] 11/11/2022 3:30 PM Medical Record Number: [id number] Patient Account Number: [id number] Date of Birth/Sex: Treating RN: 01-Nov-1945 (77 y.o. F) Primary Care Provider: PA Zenovia Jordan, NO Other Clinician: Referring Provider: Treating Provider/Extender: Junious Silk in Treatment: 2 Constitutional respirations regular, non-labored and within target range for patient.. Cardiovascular 2+ dorsalis pedis/posterior tibialis pulses. Psychiatric TOMICA, MONDO ([id number]) [id number] Page 2 of 5 pleasant and cooperative. Notes Right lower extremity: T the distal aspect near the ankle there is epithelization to the previous wound site. 2+ pitting edema to the knee. Stage III lymphedema. o No surrounding signs of infection. Electronic Signature(s) Signed: 11/11/2022 4:06:25 PM By: Geralyn Corwin DO Entered By: Geralyn Corwin on 11/11/2022 16:04:47 -------------------------------------------------------------------------------- Physician Orders Details Patient Name: Date of Service: Sandra Mo, DO RO [name] 11/11/2022 3:30 PM Medical Record Number: [id number] Patient Account Number: [id number] Date of Birth/Sex: Treating  RN: 1946-04-11 (77 y.o. [name] Primary Care Provider: PA Zenovia Jordan, [place] Other Clinician: Referring Provider: Treating Provider/Extender: Junious Silk in Treatment: 2 Verbal / Phone Orders: No Diagnosis Coding Discharge From [facility] Services Discharge from Wound Care Center - Please wear the Juxtalite compression garments on legs- Put these on in the morning and take them off at night. Edema  Control - Lymphedema / SCD / Other Bilateral Lower Extremities Elevate legs to the level of the heart or above for 30 minutes daily and/or when sitting for 3-4 times a day throughout the day. Avoid standing for long periods of time. Exercise regularly - As tolerated Moisturize legs daily. Electronic Signature(s) Signed: 11/11/2022 4:06:25 PM By: Geralyn Corwin DO Entered By: Geralyn Corwin on 11/11/2022 16:04:56 -------------------------------------------------------------------------------- Problem List Details Patient Name: Date of Service: Sandra Mo, DO RO [name] 11/11/2022 3:30 PM Medical Record Number: [id number] Patient Account Number: [id number] Date of Birth/Sex: Treating RN: Oct 11, 1945 (77 y.o. F) Primary Care Provider: PA TIENT, NO Other Clinician: Referring Provider: Treating Provider/Extender: Junious Silk in Treatment: 2 Active Problems ICD-10 Encounter Code Description Active Date MDM Diagnosis L97.812 Non-pressure chronic ulcer of other part of right lower leg with fat layer 10/28/2022 No Yes exposed 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 10/28/2022 No Yes I89.0 Lymphedema, not elsewhere classified 10/28/2022 No Yes T79.8XXA Other early complications of trauma, initial encounter 10/28/2022 No Yes OLIA, BAINE ([id number]) [id number] Page 3 of 5 I48.0 Paroxysmal atrial fibrillation 10/28/2022 No Yes Z79.01 Long term (current) use of anticoagulants  10/28/2022 No Yes Inactive Problems Resolved Problems Electronic Signature(s) Signed: 11/11/2022 4:06:25 PM By: Geralyn Corwin DO Entered By: Geralyn Corwin on 11/11/2022 16:03:20 -------------------------------------------------------------------------------- Progress Note Details Patient Name: Date of Service: Sandra Mo, DO RO [name] 11/11/2022 3:30 PM Medical Record Number: [id number] Patient Account Number: [id number] Date of Birth/Sex: Treating RN: 1946-05-26 (77 y.o. F) Primary Care Provider: PA Zenovia Jordan, NO Other Clinician: Referring Provider: Treating Provider/Extender: Junious Silk in Treatment: 2 Subjective Chief Complaint Information obtained from Patient 10/28/2022; right distal lower extremity wound History of Present Illness (HPI) 10/28/2022 Ms. Sandra [name] is a 77 year old female with a past medical history of of diastolic heart failure, paroxysmal A-fib on Eliquis, and lymphedema that presents to the clinic for a 1-77-month history of nonhealing ulcer to the right lower extremity. She states she hit her leg against a wheelchair and it created a wound. Over the past 1 to 2 months the area has gotten larger. She states she has been on all amoxicillin per her PCP for this issue. She is not currently on antibiotics And denies systemic signs of infection. She has been using silver alginate to the wound bed. She does not wear compression stockings. 5/2; patient presents for follow-up. We have been using antibiotic ointment with silver alginate under 3 layer compression to the right lower extremity. The wound is almost healed. We discussed compression stockings to have after her wounds healed. We will send orders for home health to order these. 5/9; patient presents for follow-up. We have been using antibiotic ointment with silver alginate under 3 layer compression to the right lower extremity. Her wound is healed. She states she has juxta lite compression  wraps at home. Patient History Information obtained from Patient, Chart. Family History Cancer - Mother, Lung Disease - Father. Social History Former smoker - quit 1994, Alcohol Use - Never, Drug Use - No History, Caffeine Use - Never. Medical History Cardiovascular Patient has history of Arrhythmia - A. fib, Congestive Heart Failure, Hypertension Hospitalization/Surgery History - Tonsillectomy (pt. states at 77 yrs old). Medical A Surgical History Notes nd Cardiovascular lymphedema Objective Constitutional Sandra, [name] ([id number]) [id number] Page 4 of 5 respirations regular, non-labored and within target range for patient.. Vitals Time Taken: 3:43 PM, Height: 63 in, Weight: 285 lbs, BMI:  50.5, Temperature: 98 F, Pulse: 62 bpm, Respiratory Rate: 18 breaths/min, Blood Pressure: 145/71 mmHg. Cardiovascular 2+ dorsalis pedis/posterior tibialis pulses. Psychiatric pleasant and cooperative. General Notes: Right lower extremity: T the distal aspect near the ankle there is epithelization to the previous wound site. 2+ pitting edema to the knee. Stage o III lymphedema. No surrounding signs of infection. Integumentary (Hair, Skin) Wound #1 status is Healed - Epithelialized. Original cause of wound was Gradually Appeared. The date acquired was: 09/09/2022. The wound has been in treatment 2 weeks. The wound is located on the Right,Lateral Lower Leg. The wound measures 0cm length x 0cm width x 0cm depth; 0cm^2 area and 0cm^3 volume. There is no tunneling or undermining noted. There is a none present amount of drainage noted. The wound margin is distinct with the outline attached to the wound base. There is no granulation within the wound bed. There is no necrotic tissue within the wound bed. The periwound skin appearance had no abnormalities noted for texture. The periwound skin appearance had no abnormalities noted for moisture. The periwound skin appearance  had no abnormalities noted for color. Periwound temperature was noted as No Abnormality. 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 Her wound is healed. I recommended she wear her juxta light compression daily putting these on in the morning and taking them off at night. Continue to elevate legs when sitting for long periods of time. She knows to call with any questions or concerns. She may follow-up as needed. Plan Discharge From [facility] Services: Discharge from Wound Care Center - Please wear the Juxtalite compression garments on legs- Put these on in the morning and take them off at night. Edema Control - Lymphedema / SCD / Other: Elevate legs to the level of the heart or above for 30 minutes daily and/or when sitting for 3-4 times a day throughout the day. Avoid standing for long periods of time. Exercise regularly - As tolerated Moisturize legs daily. 1. Juxta light compression daily 2. Follow-up as needed 3. Discharge from clinic due to closed wound Electronic Signature(s) Signed: 11/11/2022 4:06:25 PM By: Geralyn Corwin DO Entered By: Geralyn Corwin on 11/11/2022 16:05:37 -------------------------------------------------------------------------------- HxROS Details Patient Name: Date of Service: Sandra Mo, DO RO THY B. 11/11/2022 3:30 PM Medical Record Number: [id number] Patient Account Number: [id number] Date of Birth/Sex: Treating RN: 07/24/1945 (77 y.o. F) Primary Care Provider: PA Zenovia Jordan, NO Other Clinician: Referring Provider: Treating Provider/Extender: Junious Silk in Treatment: 2 Information Obtained From [place] ([id number]) [id number] Page 5 of 5 Patient Chart Cardiovascular Medical History: Positive for: Arrhythmia - A. fib; Congestive Heart Failure; Hypertension Past Medical History Notes: lymphedema Immunizations Pneumococcal Vaccine: Received Pneumococcal Vaccination: No Implantable Devices None Hospitalization / Surgery History Type of Hospitalization/Surgery Tonsillectomy (pt. states at 77 yrs old) Family and Social History Cancer: Yes - Mother; Lung Disease: Yes - Father; Former smoker - quit 1994; Alcohol Use: Never; Drug Use: No History; Caffeine Use: Never; Financial Concerns: No; Food, Clothing or Shelter Needs: No; Support System Lacking: No; Transportation Concerns: No Electronic Signature(s) Signed: 11/11/2022 4:06:25 PM By: Geralyn Corwin DO Entered By: Geralyn Corwin on 11/11/2022 16:04:19 -------------------------------------------------------------------------------- SuperBill Details Patient Name:  Date of Service: Sandra [name] 11/11/2022 Medical Record Number: [id number] Patient Account Number: [id number] Date of Birth/Sex: Treating RN: 24-Jun-1946 (77 y.o. F) Primary Care Provider: PA TIENT, NO Other Clinician: Referring Provider: Treating Provider/Extender: Junious Silk in Treatment: 2 Diagnosis Coding ICD-10 Codes Code Description [id number] 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posed 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 I89.0 Lymphedema, not elsewhere classified T79.8XXA Other early complications of trauma, initial encounter I48.0 Paroxysmal atrial fibrillation Z79.01 Long term (current) use of anticoagulants Physician Procedures : CPT4 Code Description Modifier [id number] - WC PHYS LEVEL 3 - EST PT ICD-10 Diagnosis  Description L97.812 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posed I87.311 Chronic venous hypertension (idiopathic) with ulcer  of right lower extremity I89.0 Lymphedema, not elsewhere classified T79.8XXA Other early complications of trauma, initial encounter Quantity: 1 Electronic Signature(s) Signed: 11/11/2022 4:06:25 PM By: Geralyn Corwin DO Entered By: Geralyn Corwin on 11/11/2022 16:05:54

## 2023-01-03 NOTE — Progress Notes (Signed)
[name], [name] ([id number]) [id number] Page 1 of 8 Visit Report for 11/11/2022 Arrival Information Details Patient Name: Date of Service: Sandra [name] 11/11/2022 3:30 PM Medical Record Number: [id number] Patient Account Number: [id number] Date of Birth/Sex: Treating RN: 04/12/1946 (77 y.o. F) Primary Care Altariq Goodall: PA TIENT, NO Other Clinician: Referring Noemy Hallmon: Treating Adron Geisel/Extender: Junious Silk in Treatment: 2 Visit Information History Since Last Visit Added or deleted any medications: No Patient Arrived: Walker Any new allergies or adverse reactions: No Arrival Time: 15:41 Had a fall or experienced change in No Accompanied By: daughter activities of daily living that may affect Transfer Assistance: None risk of falls: Patient Identification Verified: Yes Signs or symptoms of abuse/neglect since last visito No Secondary Verification Process Completed: Yes Hospitalized since last visit: No Patient Has Alerts: Yes Implantable device outside of the clinic excluding No Patient Alerts: Patient on Blood Thinner cellular tissue based products placed in the center Eliquis since last visit: Lasix Has Compression in Place as Prescribed: Yes Pain Present Now: No Electronic Signature(s) Signed: 01/03/2023 4:30:35 PM By: Thayer Dallas Entered By: Thayer Dallas on 11/11/2022 15:43:10 -------------------------------------------------------------------------------- Clinic Level of Care Assessment Details Patient Name: Date of Service: Sandra [name] 11/11/2022 3:30 PM Medical Record Number: [id number] Patient Account Number: [id number] Date of Birth/Sex: Treating RN: [birth year] (77 y.o. [name] Primary Care Anayla Giannetti: PA Zenovia Jordan, [place] Other Clinician: Referring Khing Belcher: Treating Rhealynn Myhre/Extender: Junious Silk in Treatment: 2 Clinic Level of Care Assessment Items TOOL 4 Quantity  Score X- 1 0 Use when only an EandM is performed on FOLLOW-UP visit ASSESSMENTS - Nursing Assessment / Reassessment X- 1 10 Reassessment of Co-morbidities (includes updates in patient status) X- 1 5 Reassessment of Adherence to Treatment Plan ASSESSMENTS - Wound and Skin A ssessment / Reassessment X - Simple Wound Assessment / Reassessment - one wound 1 5 []  - 0 Complex Wound Assessment / Reassessment - multiple wounds []  - 0 Dermatologic / Skin Assessment (not related to wound area) ASSESSMENTS - Focused Assessment []  - 0 Circumferential Edema Measurements - multi extremities []  - 0 Nutritional Assessment / Counseling / Intervention SWAYZE, SCHIED ([id number]) [id number] Page 2 of 8 []  - 0 Lower Extremity Assessment (monofilament, tuning fork, pulses) []  - 0 Peripheral Arterial Disease Assessment (using hand held doppler) ASSESSMENTS - Ostomy and/or Continence Assessment and Care []  - 0 Incontinence Assessment and Management []  - 0 Ostomy Care Assessment and Management (repouching, etc.) PROCESS - Coordination of Care X - Simple Patient / Family Education for ongoing care 1 15 []  - 0 Complex (extensive) Patient / Family Education for ongoing care X- 1 10 Staff obtains Chiropractor, Records, T Results / Process Orders est X- 1 10 Staff telephones HHA, Nursing Homes / Clarify orders / etc []  - 0 Routine Transfer to another Facility (non-emergent condition) []  - 0 Routine Hospital Admission (non-emergent condition) []  - 0 New Admissions / Manufacturing engineer / Ordering NPWT Apligraf, etc. , []  - 0 Emergency Hospital Admission (emergent condition) X- 1 10 Simple Discharge Coordination []  - 0 Complex (extensive) Discharge Coordination PROCESS - Special Needs []  - 0 Pediatric / Minor Patient Management []  - 0 Isolation Patient Management []  - 0 Hearing / Language / Visual special needs []  - 0 Assessment of Community assistance  (transportation, D/C planning, etc.) []  - 0 Additional assistance / Altered mentation []  - 0 Support Surface(s) Assessment (bed, cushion, seat, etc.) INTERVENTIONS - Wound Cleansing / Measurement  X - Simple Wound Cleansing - one wound 1 5 []  - 0 Complex Wound Cleansing - multiple wounds X- 1 5 Wound Imaging (photographs - any number of wounds) []  - 0 Wound Tracing (instead of photographs) []  - 0 Simple Wound Measurement - one wound []  - 0 Complex Wound Measurement - multiple wounds INTERVENTIONS - Wound Dressings X - Small Wound Dressing one or multiple wounds 1 10 []  - 0 Medium Wound Dressing one or multiple wounds []  - 0 Large Wound Dressing one or multiple wounds []  - 0 Application of Medications - topical []  - 0 Application of Medications - injection INTERVENTIONS - Miscellaneous []  - 0 External ear exam []  - 0 Specimen Collection (cultures, biopsies, blood, body fluids, etc.) []  - 0 Specimen(s) / Culture(s) sent or taken to Lab for analysis []  - 0 Patient Transfer (multiple staff / Nurse, adult / Similar devices) []  - 0 Simple Staple / Suture removal (25 or less) []  - 0 Complex Staple / Suture removal (26 or more) []  - 0 Hypo / Hyperglycemic Management (close monitor of Blood Glucose) DIARA, REIFSNYDER B ([id number]) [id number] Page 3 of 8 []  - 0 Ankle / Brachial Index (ABI) - [name] not check if billed separately X- 1 5 Vital Signs Has the patient been seen at the hospital within the last three years: Yes Total Score: 90 Level Of Care: New/Established - Level 3 Electronic Signature(s) Signed: 11/12/2022 1:01:52 PM By: Karie Schwalbe RN Entered By: Karie Schwalbe on 11/12/2022 12:59:52 -------------------------------------------------------------------------------- Encounter Discharge Information Details Patient Name: Date of Service: Sandra [name] RO [name] 11/11/2022 3:30 PM Medical Record Number: [id number] Patient Account Number:  [id number] Date of Birth/Sex: Treating RN: 03-May-1946 (77 y.o. [name] Primary Care Shelbe Haglund: PA Zenovia Jordan, [place] Other Clinician: Referring Zacaria Pousson: Treating Levora Werden/Extender: Junious Silk in Treatment: 2 Encounter Discharge Information Items Discharge Condition: Stable Ambulatory Status: Walker Discharge Destination: Home Transportation: Private Auto Accompanied By: Daughter Schedule Follow-up Appointment: Yes Clinical Summary of Care: Patient Declined Electronic Signature(s) Signed: 11/12/2022 1:01:52 PM By: Karie Schwalbe RN Entered By: Karie Schwalbe on 11/12/2022 13:00:30 -------------------------------------------------------------------------------- Lower Extremity Assessment Details Patient Name: Date of Service: Sandra [name] [place] B. 11/11/2022 3:30 PM Medical Record Number: [id number] Patient Account Number: [id number] Date of Birth/Sex: Treating RN: Jul 07, 1945 (77 y.o. [name] Primary Care Jeryl Wilbourn: PA Zenovia Jordan, [place] Other Clinician: Referring Naeem Quillin: Treating Hashim Eichhorst/Extender: Junious Silk in Treatment: 2 Edema Assessment Assessed: [Left: No] [Right: No] [Left: Edema] [Right: :] Calf Left: Right: Point of Measurement: 35 cm From Medial Instep 50 cm 49 cm Ankle Left: Right: Point of Measurement: 11 cm From Medial Instep 41 cm 39.5 cm Knee To Floor Left: Right: MARITES, WAYSON ([id number]) [id number] Page 4 of 8 From Medial Instep 41 cm 41 cm Vascular Assessment Pulses: Dorsalis Pedis Palpable: [Left:Yes] [Right:Yes] Electronic Signature(s) Signed: 11/12/2022 1:01:52 PM By: Karie Schwalbe RN Entered By: Karie Schwalbe on 11/11/2022 15:56:01 -------------------------------------------------------------------------------- Multi Wound Chart Details Patient Name: Date of Service: Sandra [name] RO [name] 11/11/2022 3:30 PM Medical Record Number: [id number] Patient  Account Number: [id number] Date of Birth/Sex: Treating RN: Sep 13, 1945 (77 y.o. F) Primary Care Shayon Trompeter: PA Zenovia Jordan, [place] Other Clinician: Referring Maevis Mumby: Treating Lillybeth Tal/Extender: Junious Silk in Treatment: 2 Vital Signs Height(in): 63 Pulse(bpm): 62 Weight(lbs): 285 Blood Pressure(mmHg): 145/71 Body Mass Index(BMI): 50.5 Temperature(F): 98 Respiratory Rate(breaths/min): 18 [1:Photos:] [N/A:N/A] Right, Lateral Lower Leg N/A N/A Wound Location: Gradually Appeared N/A N/A Wounding Event:  Lymphedema N/A N/A Primary Etiology: Arrhythmia, Congestive Heart Failure, N/A N/A Comorbid History: Hypertension 09/09/2022 N/A N/A Date Acquired: 2 N/A N/A Weeks of Treatment: Healed - Epithelialized N/A N/A Wound Status: No N/A N/A Wound Recurrence: 0x0x0 N/A N/A Measurements L x W x D (cm) 0 N/A N/A A (cm) : rea 0 N/A N/A Volume (cm) : 100.00% N/A N/A % Reduction in Area: 100.00% N/A N/A % Reduction in Volume: Full Thickness Without Exposed N/A N/A Classification: Support Structures None Present N/A N/A Exudate Amount: Distinct, outline attached N/A N/A Wound Margin: None Present (0%) N/A N/A Granulation Amount: None Present (0%) N/A N/A Necrotic Amount: Fascia: No N/A N/A Exposed Structures: Fat Layer (Subcutaneous Tissue): No Tendon: No Muscle: No Joint: No Bone: No Large (67-100%) N/A N/A Epithelialization: Excoriation: No N/A N/A Periwound Skin Texture: Induration: No Callus: No KRYSTALLE, HANDRICK ([id number]) [phone].pdf Page 5 of 8 Crepitus: No Rash: No Scarring: No Maceration: No N/A N/A Periwound Skin Moisture: Dry/Scaly: No Atrophie Blanche: No N/A N/A Periwound Skin Color: Cyanosis: No Ecchymosis: No Erythema: No Hemosiderin Staining: No Mottled: No Pallor: No Rubor: No No Abnormality N/A N/A Temperature: Treatment Notes Electronic Signature(s) Signed: 11/11/2022 4:06:25 PM By: Geralyn Corwin [name] Entered By: Geralyn Corwin on 11/11/2022 16:03:26 -------------------------------------------------------------------------------- Multi-Disciplinary Care Plan Details Patient Name: Date of Service: Sandra [name] RO [name] 11/11/2022 3:30 PM Medical Record Number: [id number] Patient Account Number: [id number] Date of Birth/Sex: Treating RN: 1945-07-08 (77 y.o. [name] Primary Care Denna Fryberger: PA Zenovia Jordan, [place] Other Clinician: Referring Marvel Sapp: Treating Arnaldo Heffron/Extender: Junious Silk in Treatment: 2 Active Inactive Electronic Signature(s) Signed: 11/12/2022 1:01:52 PM By: Karie Schwalbe RN Entered By: Karie Schwalbe on 11/12/2022 12:58:23 -------------------------------------------------------------------------------- Pain Assessment Details Patient Name: Date of Service: Sandra [name] RO [name] 11/11/2022 3:30 PM Medical Record Number: [id number] Patient Account Number: [id number] Date of Birth/Sex: Treating RN: 04/25/1946 (77 y.o. F) Primary Care Roan Miklos: PA Zenovia Jordan, [place] Other Clinician: Referring Adriyana Greenbaum: Treating Timmie Calix/Extender: Junious Silk in Treatment: 2 Active Problems Location of Pain Severity and Description of Pain Patient Has Paino No Site Locations [place], [place] B ([id number]) [id number] Page 6 of 8 Pain Management and Medication Current Pain Management: Electronic Signature(s) Signed: 01/03/2023 4:30:35 PM By: Thayer Dallas Entered By: Thayer Dallas on 11/11/2022 15:45:21 -------------------------------------------------------------------------------- Patient/Caregiver Education Details Patient Name: Date of Service: Sandra [name] RO [place] 5/9/2024andnbsp3:30 PM Medical Record Number: [id number] Patient Account Number: [id number] Date of Birth/Gender: Treating RN: 07-16-1945 (77 y.o. [name] Primary Care Physician: PA Zenovia Jordan, [place] Other Clinician: Referring  Physician: Treating Physician/Extender: Junious Silk in Treatment: 2 Education Assessment Education Provided To: Patient Education Topics Provided Wound/Skin Impairment: Methods: Explain/Verbal Responses: Return demonstration correctly Electronic Signature(s) Signed: 11/12/2022 1:01:52 PM By: Karie Schwalbe RN Entered By: Karie Schwalbe on 11/12/2022 12:58:36 -------------------------------------------------------------------------------- Wound Assessment Details Patient Name: Date of Service: Sandra [name] RO [name] 11/11/2022 3:30 PM Medical Record Number: [id number] Patient Account Number: [id number] Date of Birth/Sex: Treating RN: 10/31/1945 (77 y.o. F) Primary Care Ayani Ospina: PA Zenovia [place], [place] Other Clinician: Referring Audi Wettstein: Treating Samentha Perham/Extender: Chantrelle, [place], Willis [place] ([id number]) [id number] Page 7 of 8 Weeks in Treatment: 2 Wound Status Wound Number: 1 Primary Etiology: Lymphedema Wound Location: Right, Lateral Lower Leg Wound Status: Healed - Epithelialized Wounding Event: Gradually Appeared Comorbid History: Arrhythmia, Congestive Heart Failure, Hypertension Date Acquired: 09/09/2022 Weeks Of Treatment: 2 Clustered Wound: No Photos Wound Measurements Length: (cm) Width: (cm) Depth: (  cm) Area: (cm) Volume: (cm) 0 % Reduction in Area: 100% 0 % Reduction in Volume: 100% 0 Epithelialization: Large (67-100%) 0 Tunneling: No 0 Undermining: No Wound Description Classification: Full Thickness Without Exposed Support Wound Margin: Distinct, outline attached Exudate Amount: None Present Structures Foul Odor After Cleansing: No Slough/Fibrino Yes Wound Bed Granulation Amount: None Present (0%) Exposed Structure Necrotic Amount: None Present (0%) Fascia Exposed: No Fat Layer (Subcutaneous Tissue) Exposed: No Tendon Exposed: No Muscle Exposed: No Joint Exposed: No Bone Exposed:  No Periwound Skin Texture Texture Color No Abnormalities Noted: Yes No Abnormalities Noted: Yes Moisture Temperature / Pain No Abnormalities Noted: Yes Temperature: No Abnormality Electronic Signature(s) Signed: 11/12/2022 1:01:52 PM By: Karie Schwalbe RN Entered By: Karie Schwalbe on 11/11/2022 16:00:53 -------------------------------------------------------------------------------- Vitals Details Patient Name: Date of Service: Sandra [name] RO [name] 11/11/2022 3:30 PM Medical Record Number: [id number] Patient Account Number: [id number] Date of Birth/Sex: Treating RN: 09/01/1945 (77 y.o. F) Primary Care Shamaria Kavan: PA Zenovia Jordan, [place] Other Clinician: Referring Sai Zinn: Treating Hernandez [place]/Extender: Junious Silk in Treatment: 2 Jeannie Fend ([id number]) [id number] Page 8 of 8 Vital Signs Time Taken: 15:43 Temperature (F): 98 Height (in): 63 Pulse (bpm): 62 Weight (lbs): 285 Respiratory Rate (breaths/min): 18 Body Mass Index (BMI): 50.5 Blood Pressure (mmHg): 145/71 Reference Range: 80 - 120 mg / dl Electronic Signature(s) Signed: 01/03/2023 4:30:35 PM By: Thayer Dallas Entered By: Thayer Dallas on 11/11/2022 15:45:16

## 2023-09-03 ENCOUNTER — Other Ambulatory Visit: Payer: Self-pay | Admitting: Internal Medicine

## 2023-09-26 ENCOUNTER — Other Ambulatory Visit: Payer: Self-pay | Admitting: Internal Medicine

## 2024-07-19 ENCOUNTER — Other Ambulatory Visit: Payer: Self-pay | Admitting: Internal Medicine

## 2024-08-07 ENCOUNTER — Other Ambulatory Visit: Payer: Self-pay | Admitting: Internal Medicine
